# Patient Record
Sex: Female | Born: 1955 | Race: Asian | Hispanic: No | Marital: Married | Smoking: Never smoker
Health system: Southern US, Community
[De-identification: ages and names within clinical notes are randomized; demographics above are authoritative.]

## PROBLEM LIST (undated history)

## (undated) DIAGNOSIS — M810 Age-related osteoporosis without current pathological fracture: Secondary | ICD-10-CM

## (undated) DIAGNOSIS — T7840XA Allergy, unspecified, initial encounter: Secondary | ICD-10-CM

## (undated) DIAGNOSIS — M1990 Unspecified osteoarthritis, unspecified site: Secondary | ICD-10-CM

## (undated) DIAGNOSIS — N952 Postmenopausal atrophic vaginitis: Secondary | ICD-10-CM

## (undated) DIAGNOSIS — F32A Depression, unspecified: Secondary | ICD-10-CM

## (undated) HISTORY — PX: CYST EXCISION: SHX5701

## (undated) HISTORY — DX: Postmenopausal atrophic vaginitis: N95.2

## (undated) HISTORY — PX: ABDOMINAL HYSTERECTOMY: SHX81

## (undated) HISTORY — PX: BREAST BIOPSY: SHX20

## (undated) HISTORY — DX: Allergy, unspecified, initial encounter: T78.40XA

## (undated) HISTORY — DX: Age-related osteoporosis without current pathological fracture: M81.0

## (undated) HISTORY — DX: Depression, unspecified: F32.A

## (undated) HISTORY — DX: Unspecified osteoarthritis, unspecified site: M19.90

---

## 2010-03-01 DIAGNOSIS — D0590 Unspecified type of carcinoma in situ of unspecified breast: Secondary | ICD-10-CM | POA: Insufficient documentation

## 2010-04-22 DIAGNOSIS — J309 Allergic rhinitis, unspecified: Secondary | ICD-10-CM | POA: Insufficient documentation

## 2010-04-22 DIAGNOSIS — L501 Idiopathic urticaria: Secondary | ICD-10-CM | POA: Insufficient documentation

## 2013-01-22 DIAGNOSIS — Z90710 Acquired absence of both cervix and uterus: Secondary | ICD-10-CM | POA: Insufficient documentation

## 2019-11-27 DIAGNOSIS — R220 Localized swelling, mass and lump, head: Secondary | ICD-10-CM | POA: Insufficient documentation

## 2020-02-16 DIAGNOSIS — M7651 Patellar tendinitis, right knee: Secondary | ICD-10-CM | POA: Insufficient documentation

## 2020-02-16 DIAGNOSIS — R7303 Prediabetes: Secondary | ICD-10-CM | POA: Insufficient documentation

## 2020-02-16 DIAGNOSIS — M19041 Primary osteoarthritis, right hand: Secondary | ICD-10-CM | POA: Insufficient documentation

## 2020-02-16 DIAGNOSIS — G8929 Other chronic pain: Secondary | ICD-10-CM | POA: Insufficient documentation

## 2021-01-17 LAB — HM DEXA SCAN

## 2021-02-28 LAB — HM MAMMOGRAPHY

## 2021-03-09 ENCOUNTER — Other Ambulatory Visit: Payer: Self-pay

## 2021-03-09 ENCOUNTER — Ambulatory Visit (LOCAL_COMMUNITY_HEALTH_CENTER): Payer: Medicare HMO

## 2021-03-09 DIAGNOSIS — Z23 Encounter for immunization: Secondary | ICD-10-CM

## 2021-03-09 NOTE — Progress Notes (Signed)
In Nurse Clinic with husband. Reports Td vaccine greater than 10 yrs ago. Tdap administered today and tolerated well. Updated [facility] copy given. Jerel Shepherd, RN

## 2021-03-28 DIAGNOSIS — N390 Urinary tract infection, site not specified: Secondary | ICD-10-CM | POA: Insufficient documentation

## 2021-07-14 DIAGNOSIS — R5383 Other fatigue: Secondary | ICD-10-CM | POA: Diagnosis not present

## 2021-07-14 DIAGNOSIS — R059 Cough, unspecified: Secondary | ICD-10-CM | POA: Diagnosis not present

## 2021-07-14 DIAGNOSIS — U071 COVID-19: Secondary | ICD-10-CM | POA: Diagnosis not present

## 2021-07-14 DIAGNOSIS — R509 Fever, unspecified: Secondary | ICD-10-CM | POA: Diagnosis not present

## 2021-07-14 DIAGNOSIS — R051 Acute cough: Secondary | ICD-10-CM | POA: Diagnosis not present

## 2021-07-26 DIAGNOSIS — R059 Cough, unspecified: Secondary | ICD-10-CM | POA: Diagnosis not present

## 2021-07-26 DIAGNOSIS — J22 Unspecified acute lower respiratory infection: Secondary | ICD-10-CM | POA: Diagnosis not present

## 2021-07-26 DIAGNOSIS — B9689 Other specified bacterial agents as the cause of diseases classified elsewhere: Secondary | ICD-10-CM | POA: Diagnosis not present

## 2021-07-26 DIAGNOSIS — R051 Acute cough: Secondary | ICD-10-CM | POA: Diagnosis not present

## 2021-07-26 DIAGNOSIS — J329 Chronic sinusitis, unspecified: Secondary | ICD-10-CM | POA: Diagnosis not present

## 2021-08-23 DIAGNOSIS — J329 Chronic sinusitis, unspecified: Secondary | ICD-10-CM | POA: Diagnosis not present

## 2021-08-23 DIAGNOSIS — R918 Other nonspecific abnormal finding of lung field: Secondary | ICD-10-CM | POA: Diagnosis not present

## 2021-08-23 DIAGNOSIS — J188 Other pneumonia, unspecified organism: Secondary | ICD-10-CM | POA: Diagnosis not present

## 2021-08-23 DIAGNOSIS — Z8616 Personal history of COVID-19: Secondary | ICD-10-CM | POA: Diagnosis not present

## 2021-08-23 DIAGNOSIS — B9689 Other specified bacterial agents as the cause of diseases classified elsewhere: Secondary | ICD-10-CM | POA: Diagnosis not present

## 2021-08-23 DIAGNOSIS — J948 Other specified pleural conditions: Secondary | ICD-10-CM | POA: Diagnosis not present

## 2021-08-30 ENCOUNTER — Encounter: Payer: Self-pay | Admitting: Internal Medicine

## 2021-08-30 ENCOUNTER — Ambulatory Visit (INDEPENDENT_AMBULATORY_CARE_PROVIDER_SITE_OTHER): Payer: Medicare Other | Admitting: Internal Medicine

## 2021-08-30 VITALS — BP 124/72 | HR 81 | Temp 98.1°F | Resp 16 | Ht 61.0 in | Wt 131.9 lb

## 2021-08-30 DIAGNOSIS — E785 Hyperlipidemia, unspecified: Secondary | ICD-10-CM | POA: Insufficient documentation

## 2021-08-30 DIAGNOSIS — M810 Age-related osteoporosis without current pathological fracture: Secondary | ICD-10-CM | POA: Insufficient documentation

## 2021-08-30 DIAGNOSIS — R058 Other specified cough: Secondary | ICD-10-CM | POA: Diagnosis not present

## 2021-08-30 DIAGNOSIS — M816 Localized osteoporosis [Lequesne]: Secondary | ICD-10-CM | POA: Diagnosis not present

## 2021-08-30 DIAGNOSIS — Z1211 Encounter for screening for malignant neoplasm of colon: Secondary | ICD-10-CM

## 2021-08-30 DIAGNOSIS — E782 Mixed hyperlipidemia: Secondary | ICD-10-CM

## 2021-08-30 MED ORDER — FLUTICASONE PROPIONATE 50 MCG/ACT NA SUSP
2.0000 | Freq: Every day | NASAL | 6 refills | Status: DC
Start: 2021-08-30 — End: 2022-02-27

## 2021-08-30 NOTE — Patient Instructions (Addendum)
It was great seeing you today!  Plan discussed at today's visit: -Continue Fosamax weekly, be sure to drink a full glass of water with this medication and do not lay down for 30 minutes after -Take at least 1000 IU Vitamin D daily  -Increase walking to 30 minutes 5 times a day  -For shoulder pain, take anti-inflammatories and exercises to increase range of motion  -Cologuard ordered for colon cancer screening   Follow up in: 6 months   Take care and let us know if you have any questions or concerns prior to your next visit.  Dr. Caralee Ates  Shoulder Range of Motion Exercises Shoulder range of motion (ROM) exercises are done to keep the shoulder moving freely or to increase movement. They are often recommended for people who have shoulder pain or stiffness or who are recovering from a shoulder surgery. Phase 1 exercises When you are able, do this exercise 1-2 times per day for 30-60 seconds in each direction, or as directed by your health care provider. Pendulum exercise To do this exercise while sitting: Sit in a chair or at the edge of your bed with your feet flat on the floor. Let your affected arm hang down in front of you over the edge of the bed or chair. Relax your shoulder, arm, and hand. Rock your body so your arm gently swings in small circles. You can also use your unaffected arm to start the motion. Repeat changing the direction of the circles, swinging your arm left and right, and swinging your arm forward and back. To do this exercise while standing: Stand next to a sturdy chair or table, and hold on to it with your hand on your unaffected side. Bend forward at the waist. Bend your knees slightly. Relax your shoulder, arm, and hand. While keeping your shoulder relaxed, use body motion to swing your arm in small circles. Repeat changing the direction of the circles, swinging your arm left and right, and swinging your arm forward and back. Between exercises, stand up tall and  take a short break to relax your lower back.   Phase 2 exercises Do these exercises 1-2 times per day or as told by your health care provider. Hold each stretch for 30 seconds, and repeat 3 times. Do the exercises with one or both arms as instructed by your health care provider. For these exercises, sit at a table with your hand and arm supported by the table. A chair that slides easily or has wheels can be helpful. External rotation Turn your chair so that your affected side is nearest to the table. Place your forearm on the table to your side. Bend your elbow about 90 at the elbow (right angle) and place your hand palm facing down on the table. Your elbow should be about 6 inches away from your side. Keeping your arm on the table, lean your body forward. Abduction Turn your chair so that your affected side is nearest to the table. Place your forearm and hand on the table so that your thumb points toward the ceiling and your arm is straight out to your side. Slide your hand out to the side and away from you, using your unaffected arm to do the work. To increase the stretch, you can slide your chair away from the table. Flexion: forward stretch Sit facing the table. Place your hand and elbow on the table in front of you. Slide your hand forward and away from you, using your unaffected arm to  do the work. To increase the stretch, you can slide your chair backward. Phase 3 exercises Do these exercises 1-2 times per day or as told by your health care provider. Hold each stretch for 30 seconds, and repeat 3 times. Do the exercises with one or both arms as instructed by your health care provider. Cross-body stretch: posterior capsule stretch Lift your arm straight out in front of you. Bend your arm 90 at the elbow (right angle) so your forearm moves across your body. Use your other arm to gently pull the elbow across your body, toward your other shoulder. Wall climbs Stand with your affected  arm extended out to the side with your hand resting on a door frame. Slide your hand slowly up the door frame. To increase the stretch, step through the door frame. Keep your body upright and do not lean. Wand exercises You will need a cane, a piece of PVC pipe, or a sturdy wooden dowel for wand exercises. Flexion To do this exercise while standing: Hold the wand with both of your hands, palms down. Using the other arm to help, lift your arms up and over your head, if able. Push upward with your other arm to gently increase the stretch. To do this exercise while lying down: Lie on your back with your elbows resting on the floor and the wand in both your hands. Your hands will be palm down, or pointing toward your feet. Lift your hands toward the ceiling, using your unaffected arm to help if needed. Bring your arms overhead as able, using your unaffected arm to help if needed. Internal rotation Stand while holding the wand behind you with both hands. Your unaffected arm should be extended above your head with the arm of the affected side extended behind you at the level of your waist. The wand should be pointing straight up and down as you hold it. Slowly pull the wand up behind your back by straightening the elbow of your unaffected arm and bending the elbow of your affected arm. External rotation Lie on your back with your affected upper arm supported on a small pillow or rolled towel. When you first do this exercise, keep your upper arm close to your body. Over time, bring your arm up to a 90 angle out to the side. Hold the wand across your stomach and with both hands palm up. Your elbow on your affected side should be bent at a 90 angle. Use your unaffected side to help push your forearm away from you and toward the floor. Keep your elbow on your affected side bent at a 90 angle. Contact a health care provider if you have: New or increasing pain. New numbness, tingling, weakness, or  discoloration in your arm or hand. This information is not intended to replace advice given to you by your health care provider. Make sure you discuss any questions you have with your health care provider. Document Revised: 08/08/2017 Document Reviewed: 08/08/2017 Elsevier Patient Education  2022 [employer].

## 2021-08-30 NOTE — Assessment & Plan Note (Signed)
Per DEXA scan 7/22. Started on Fosamax August, 2022. Discussed Vitamin D and calcium and weight bearing exercises. Plan to repeat bone scan in 2 years.

## 2021-08-30 NOTE — Progress Notes (Signed)
New Patient Office Visit  Subjective:  Patient ID: Jade Reese, female    DOB: [birth year]  Age: 66 y.o. MRN: [id number]  CC:  Chief Complaint  Patient presents with   Establish Care   post covid    Night cough.  Pt states went to [facility] had abnormal xray x2   Joint Pain    Right hand ring knuckle, elbow pain and knee pain   Cyst    On forehead     HPI Jade Reese presents as a new patient. Chronic  medical conditions include osteoporosis diagnosed last summer.   Osteoporosis: -Per DEXA 7/11 lumbar spine -3.3, left hip -1.9 -Taking Fosamax weekly since August 2022 -Taking Vitamin D as well, uncertain of dose -Vitamin D levels in 7/22 low/normal at 30.3   Left Shoulder Pain: [place] on stairs after a mechanical fall last November and fell on her left shoulder  Location: posterior Quality:  aching Frequency: intermittent Radiation: no Aggravating factors: lifting  Alleviating factors: rest  Status: better ROM still slightly limited, working on strength   Chronic Cough Post-COVID: -Tested positive for COVID 07/12/21, treated with anti-virals -Went to UC and had CXR which showed RUL PNA. She was treated with antibiotics and followed up for repeat x-ray, which I cannot view -Current symptoms: cough at night some days, voice hoarseness. Denies fevers, SOB, wheezing, sore throat.  HLD: -Medications: None -Last lipid panel: 7/22 TC 207, triglycerides 135, LDL 120, HDL 60  The 10-year ASCVD risk score (Arnett DK, et al., 2019) is: 5.7%   Values used to calculate the score:     Age: 41 years     Sex: Female     Is Non-Hispanic African American: No     Diabetic: No     Tobacco smoker: No     Systolic Blood Pressure: [id number] mmHg     Is BP treated: No     HDL Cholesterol: 60 mg/dL     Total Cholesterol: 207 mg/dL  Health Maintenance: -Blood work UTD -Mammogram/DEXA: Mammogram last summer, plan to schedule again this summer. DEXA results above.  -Colon cancer screening:  uncertain    Past Medical History:  Diagnosis Date   Arthritis    Osteoporosis    Vaginal atrophy     No family history on file.  Social History   Socioeconomic History   Marital status: Married    Spouse name: Not on file   Number of children: Not on file   Years of education: Not on file   Highest education level: Not on file  Occupational History   Not on file  Tobacco Use   Smoking status: Not on file   Smokeless tobacco: Not on file  Substance and Sexual Activity   Alcohol use: Not on file   Drug use: Not on file   Sexual activity: Not on file  Other Topics Concern   Not on file  Social History Narrative   Not on file   Social Determinants of Health   Financial Resource Strain: Not on file  Food Insecurity: Not on file  Transportation Needs: Not on file  Physical Activity: Not on file  Stress: Not on file  Social Connections: Not on file  Intimate Partner Violence: Not on file    ROS Review of Systems  Constitutional:  Negative for chills and fever.  HENT:  Positive for postnasal drip and voice change. Negative for congestion, sinus pressure, sinus pain and sore throat.   Eyes:  Negative for visual disturbance.  Respiratory:  Positive for cough. Negative for shortness of breath and wheezing.   Cardiovascular:  Negative for chest pain.  Gastrointestinal:  Negative for abdominal pain, nausea and vomiting.  Musculoskeletal:  Positive for arthralgias.  Neurological:  Negative for dizziness and headaches.   Objective:   Today's Vitals: BP 124/72    Pulse 81    Temp 98.1 F (36.7 C)    Resp 16    Ht 5\' 1"  (1.549 m)    Wt 131 lb 14.4 oz (59.8 kg)    SpO2 97%    BMI 24.92 kg/m   Physical Exam Constitutional:      Appearance: Normal appearance.  HENT:     Head: Normocephalic and atraumatic.     Mouth/Throat:     Mouth: Mucous membranes are moist.     Comments: Post nasal drip present Eyes:     Conjunctiva/sclera: Conjunctivae normal.   Cardiovascular:     Rate and Rhythm: Normal rate and regular rhythm.  Pulmonary:     Effort: Pulmonary effort is normal.     Breath sounds: Normal breath sounds.  Musculoskeletal:        General: Tenderness present.     Right lower leg: No edema.     Left lower leg: No edema.     Comments: Tenderness over spine of scapula on left, ROM 5/5 of left shoulder with exception abduction 4/5  Skin:    General: Skin is warm and dry.  Neurological:     General: No focal deficit present.     Mental Status: She is alert. Mental status is at baseline.  Psychiatric:        Mood and Affect: Mood normal.        Behavior: Behavior normal.    Assessment & Plan:   Problem List Items Addressed This Visit       Musculoskeletal and Integument   Osteoporosis    Per DEXA scan 7/22. Started on Fosamax August, 2022. Discussed Vitamin D and calcium and weight bearing exercises. Plan to repeat bone scan in 2 years.      Relevant Medications   alendronate (FOSAMAX) 70 MG tablet     Other   HLD (hyperlipidemia)    Reviewed last lipid panel, ASCVD risk 5.7%. Discussed lifestyle management, plan to recheck at follow up.       Other Visit Diagnoses     Colon cancer screening    -  Primary   Relevant Orders   Cologuard   Post-viral cough syndrome       Relevant Medications   fluticasone (FLONASE) 50 MCG/ACT nasal spray       Outpatient Encounter Medications as of 08/30/2021  Medication Sig   Phenazopyridine HCl (AZO TABS PO) Take by mouth.   No facility-administered encounter medications on file as of 08/30/2021.    Follow-up: Return in about 6 months (around 02/27/2022).   Teodora Medici, DO

## 2021-08-30 NOTE — Assessment & Plan Note (Signed)
Reviewed last lipid panel, ASCVD risk 5.7%. Discussed lifestyle management, plan to recheck at follow up.

## 2021-09-07 ENCOUNTER — Encounter: Payer: Self-pay | Admitting: Internal Medicine

## 2021-09-07 DIAGNOSIS — R058 Other specified cough: Secondary | ICD-10-CM

## 2021-09-07 DIAGNOSIS — J948 Other specified pleural conditions: Secondary | ICD-10-CM

## 2021-09-08 NOTE — Telephone Encounter (Signed)
Pt was informed.

## 2021-09-09 ENCOUNTER — Encounter: Payer: Self-pay | Admitting: Internal Medicine

## 2021-09-24 DIAGNOSIS — Z1211 Encounter for screening for malignant neoplasm of colon: Secondary | ICD-10-CM | POA: Diagnosis not present

## 2021-10-01 LAB — COLOGUARD: COLOGUARD: NEGATIVE

## 2021-10-10 ENCOUNTER — Ambulatory Visit
Admission: RE | Admit: 2021-10-10 | Discharge: 2021-10-10 | Disposition: A | Discharge status: Home / Self Care | Payer: Medicare Other | Attending: Internal Medicine | Admitting: Internal Medicine

## 2021-10-10 ENCOUNTER — Ambulatory Visit
Admission: RE | Admit: 2021-10-10 | Discharge: 2021-10-10 | Disposition: A | Discharge status: Home / Self Care | Payer: Medicare Other | Source: Ambulatory Visit | Attending: Internal Medicine | Admitting: Internal Medicine

## 2021-10-10 DIAGNOSIS — R058 Other specified cough: Secondary | ICD-10-CM | POA: Insufficient documentation

## 2021-10-10 DIAGNOSIS — J948 Other specified pleural conditions: Secondary | ICD-10-CM

## 2021-10-21 ENCOUNTER — Telehealth: Payer: Self-pay | Admitting: Internal Medicine

## 2021-10-21 NOTE — Telephone Encounter (Signed)
Copied from [place] [phone]. Topic: Medicare AWV ?>> Oct 21, 2021  9:10 AM Cher Nakai R wrote: ?Reason for CRM:  ? ?Left message for patient to call back and schedule Medicare Annual Wellness Visit (AWV) in office.  ? ?If unable to come into the office for AWV,  please offer to do virtually or by telephone. ? ?No hx of AWV eligible for AWVI per [facility] as of 06/09/2021 ? ?Please schedule at anytime with [place].     ? ?45 minute appointment  ? ?Any questions, please call me at [phone] ?

## 2021-11-03 ENCOUNTER — Ambulatory Visit (INDEPENDENT_AMBULATORY_CARE_PROVIDER_SITE_OTHER): Payer: Medicare Other

## 2021-11-03 DIAGNOSIS — Z0000 Encounter for general adult medical examination without abnormal findings: Secondary | ICD-10-CM | POA: Diagnosis not present

## 2021-11-03 NOTE — Patient Instructions (Signed)
Ms. Smyre , ?Thank you for taking time to come for your Medicare Wellness Visit. I appreciate your ongoing commitment to your health goals. Please review the following plan we discussed and let me know if I can assist you in the future.  ? ?Screening recommendations/referrals: ?Colonoscopy: Cologuard done 09/24/21. Repeat 09/2024 ?Mammogram: done 02/28/21 ?Bone Density: done 01/17/21 ?Recommended yearly ophthalmology/optometry visit for glaucoma screening and checkup ?Recommended yearly dental visit for hygiene and checkup ? ?Vaccinations: ?Influenza vaccine: done 06/09/21 ?Pneumococcal vaccine: done 01/11/21 ?Tdap vaccine: done 03/09/21 ?Shingles vaccine: please bring vaccine record to your next appt   ?Covid-19:done 10/16/19, 11/06/19, 06/30/20 & 03/01/21 ? ?Advanced directives: Advance directive discussed with you today. I have provided a copy for you to complete at home and have notarized. Once this is complete please bring a copy in to our office so we can scan it into your chart.  ? ?Conditions/risks identified: Keep up the great work! ? ?Next appointment: Follow up in one year for your annual wellness visit  ? ? ?Preventive Care 67 Years and Older, Female ?Preventive care refers to lifestyle choices and visits with your health care provider that can promote health and wellness. ?What does preventive care include? ?A yearly physical exam. This is also called an annual well check. ?Dental exams once or twice a year. ?Routine eye exams. Ask your health care provider how often you should have your eyes checked. ?Personal lifestyle choices, including: ?Daily care of your teeth and gums. ?Regular physical activity. ?Eating a healthy diet. ?Avoiding tobacco and drug use. ?Limiting alcohol use. ?Practicing safe sex. ?Taking low-dose aspirin every day. ?Taking vitamin and mineral supplements as recommended by your health care provider. ?What happens during an annual well check? ?The services and screenings done by your health  care provider during your annual well check will depend on your age, overall health, lifestyle risk factors, and family history of disease. ?Counseling  ?Your health care provider may ask you questions about your: ?Alcohol use. ?Tobacco use. ?Drug use. ?Emotional well-being. ?Home and relationship well-being. ?Sexual activity. ?Eating habits. ?History of falls. ?Memory and ability to understand (cognition). ?Work and work Astronomer. ?Reproductive health. ?Screening  ?You may have the following tests or measurements: ?Height, weight, and BMI. ?Blood pressure. ?Lipid and cholesterol levels. These may be checked every 5 years, or more frequently if you are over 2 years old. ?Skin check. ?Lung cancer screening. You may have this screening every year starting at age 51 if you have a 30-pack-year history of smoking and currently smoke or have quit within the past 15 years. ?Fecal occult blood test (FOBT) of the stool. You may have this test every year starting at age 49. ?Flexible sigmoidoscopy or colonoscopy. You may have a sigmoidoscopy every 5 years or a colonoscopy every 10 years starting at age 50. ?Hepatitis C blood test. ?Hepatitis B blood test. ?Sexually transmitted disease (STD) testing. ?Diabetes screening. This is done by checking your blood sugar (glucose) after you have not eaten for a while (fasting). You may have this done every 1-3 years. ?Bone density scan. This is done to screen for osteoporosis. You may have this done starting at age 74. ?Mammogram. This may be done every 1-2 years. Talk to your health care provider about how often you should have regular mammograms. ?Talk with your health care provider about your test results, treatment options, and if necessary, the need for more tests. ?Vaccines  ?Your health care provider may recommend certain vaccines, such as: ?Influenza vaccine. This  is recommended every year. ?Tetanus, diphtheria, and acellular pertussis (Tdap, Td) vaccine. You may need a Td  booster every 10 years. ?Zoster vaccine. You may need this after age 18. ?Pneumococcal 13-valent conjugate (PCV13) vaccine. One dose is recommended after age 53. ?Pneumococcal polysaccharide (PPSV23) vaccine. One dose is recommended after age 41. ?Talk to your health care provider about which screenings and vaccines you need and how often you need them. ?This information is not intended to replace advice given to you by your health care provider. Make sure you discuss any questions you have with your health care provider. ?Document Released: 07/23/2015 Document Revised: 03/15/2016 Document Reviewed: 04/27/2015 ?Elsevier Interactive Patient Education ? 2017 [employer]. ? ?Fall Prevention in the Home ?Falls can cause injuries. They can happen to people of all ages. There are many things you can do to make your home safe and to help prevent falls. ?What can I do on the outside of my home? ?Regularly fix the edges of walkways and driveways and fix any cracks. ?Remove anything that might make you trip as you walk through a door, such as a raised step or threshold. ?Trim any bushes or trees on the path to your home. ?Use bright outdoor lighting. ?Clear any walking paths of anything that might make someone trip, such as rocks or tools. ?Regularly check to see if handrails are loose or broken. Make sure that both sides of any steps have handrails. ?Any raised decks and porches should have guardrails on the edges. ?Have any leaves, snow, or ice cleared regularly. ?Use sand or salt on walking paths during winter. ?Clean up any spills in your garage right away. This includes oil or grease spills. ?What can I do in the bathroom? ?Use night lights. ?Install grab bars by the toilet and in the tub and shower. Do not use towel bars as grab bars. ?Use non-skid mats or decals in the tub or shower. ?If you need to sit down in the shower, use a plastic, non-slip stool. ?Keep the floor dry. Clean up any water that spills on the floor  as soon as it happens. ?Remove soap buildup in the tub or shower regularly. ?Attach bath mats securely with double-sided non-slip rug tape. ?Do not have throw rugs and other things on the floor that can make you trip. ?What can I do in the bedroom? ?Use night lights. ?Make sure that you have a light by your bed that is easy to reach. ?Do not use any sheets or blankets that are too big for your bed. They should not hang down onto the floor. ?Have a firm chair that has side arms. You can use this for support while you get dressed. ?Do not have throw rugs and other things on the floor that can make you trip. ?What can I do in the kitchen? ?Clean up any spills right away. ?Avoid walking on wet floors. ?Keep items that you use a lot in easy-to-reach places. ?If you need to reach something above you, use a strong step stool that has a grab bar. ?Keep electrical cords out of the way. ?Do not use floor polish or wax that makes floors slippery. If you must use wax, use non-skid floor wax. ?Do not have throw rugs and other things on the floor that can make you trip. ?What can I do with my stairs? ?Do not leave any items on the stairs. ?Make sure that there are handrails on both sides of the stairs and use them. Fix handrails that  are broken or loose. Make sure that handrails are as long as the stairways. ?Check any carpeting to make sure that it is firmly attached to the stairs. Fix any carpet that is loose or worn. ?Avoid having throw rugs at the top or bottom of the stairs. If you do have throw rugs, attach them to the floor with carpet tape. ?Make sure that you have a light switch at the top of the stairs and the bottom of the stairs. If you do not have them, ask someone to add them for you. ?What else can I do to help prevent falls? ?Wear shoes that: ?Do not have high heels. ?Have rubber bottoms. ?Are comfortable and fit you well. ?Are closed at the toe. Do not wear sandals. ?If you use a stepladder: ?Make sure that it is  fully opened. Do not climb a closed stepladder. ?Make sure that both sides of the stepladder are locked into place. ?Ask someone to hold it for you, if possible. ?Clearly mark and make sure that you can

## 2021-11-03 NOTE — Progress Notes (Signed)
? ?Subjective:  ? Jade Reese is a 66 y.o. female who presents for an Initial Medicare Annual Wellness Visit. ? ?Virtual Visit via Telephone Note ? ?I connected with  Jade Reese on 11/03/21 at  3:30 PM EDT by telephone and verified that I am speaking with the correct person using two identifiers. ? ?Location: ?Patient: home ?Provider: [facility] ?Persons participating in the virtual visit: patient/Nurse Health Advisor ?  ?I discussed the limitations, risks, security and privacy concerns of performing an evaluation and management service by telephone and the availability of in person appointments. The patient expressed understanding and agreed to proceed. ? ?Interactive audio and video telecommunications were attempted between this nurse and patient, however failed, due to patient having technical difficulties OR patient did not have access to video capability.  We continued and completed visit with audio only. ? ?Some vital signs may be absent or patient reported.  ? ?Reather Littler, LPN ? ? ?Review of Systems    ? ?Cardiac Risk Factors include: advanced age (>39men, >4 women) ? ?   ?Objective:  ?  ?Today's Vitals  ? 11/03/21 1518  ?PainSc: 2   ? ?There is no height or weight on file to calculate BMI. ? ? ?  11/03/2021  ?  3:30 PM  ?Advanced Directives  ?Does Patient Have a Medical Advance Directive? No  ?Would patient like information on creating a medical advance directive? Yes (MAU/Ambulatory/Procedural Areas - Information given)  ? ? ?Current Medications (verified) ?Outpatient Encounter Medications as of 11/03/2021  ?Medication Sig  ? alendronate (FOSAMAX) 70 MG tablet Take by mouth.  ? Calcium Carb-Cholecalciferol (CALCIUM 600+D3 PO) Take by mouth.  ? fluticasone (FLONASE) 50 MCG/ACT nasal spray Place 2 sprays into both nostrils daily.  ? Multiple Vitamins-Minerals (MULTIVITAMIN WOMEN) TABS Take by mouth.  ? Polyethylene Glycol 3350 (MIRALAX PO) Take by mouth.  ? [DISCONTINUED] estradiol (ESTRACE) 0.1 MG/GM  vaginal cream Place vaginally.  ? ?No facility-administered encounter medications on file as of 11/03/2021.  ? ? ?Allergies (verified) ?Penicillins, Bee venom, Oxaprozin, and Sodium hyaluronate (avian)  ? ?History: ?Past Medical History:  ?Diagnosis Date  ? Arthritis   ? Osteoporosis   ? Vaginal atrophy   ? ?History reviewed. No pertinent family history. ?Social History  ? ?Socioeconomic History  ? Marital status: Married  ?  Spouse name: Not on file  ? Number of children: Not on file  ? Years of education: Not on file  ? Highest education level: Not on file  ?Occupational History  ? Not on file  ?Tobacco Use  ? Smoking status: Never  ? Smokeless tobacco: Never  ?Vaping Use  ? Vaping Use: Never used  ?Substance and Sexual Activity  ? Alcohol use: Never  ? Drug use: Never  ? Sexual activity: Yes  ?Other Topics Concern  ? Not on file  ?Social History Narrative  ? Not on file  ? ?Social Determinants of Health  ? ?Financial Resource Strain: Low Risk   ? Difficulty of Paying Living Expenses: Not hard at all  ?Food Insecurity: No Food Insecurity  ? Worried About Programme researcher, broadcasting/film/video in the Last Year: Never true  ? Ran Out of Food in the Last Year: Never true  ?Transportation Needs: No Transportation Needs  ? Lack of Transportation (Medical): No  ? Lack of Transportation (Non-Medical): No  ?Physical Activity: Sufficiently Active  ? Days of Exercise per Week: 6 days  ? Minutes of Exercise per Session: 60 min  ?Stress:  No Stress Concern Present  ? Feeling of Stress : Not at all  ?Social Connections: Moderately Isolated  ? Frequency of Communication with Friends and Family: More than three times a week  ? Frequency of Social Gatherings with Friends and Family: Twice a week  ? Attends Religious Services: Never  ? Active Member of Clubs or Organizations: No  ? Attends BankerClub or Organization Meetings: Never  ? Marital Status: Married  ? ? ?Tobacco Counseling ?Counseling given: Not Answered ? ? ?Clinical Intake: ? ?Pre-visit  preparation completed: Yes ? ?Pain : 0-10 ?Pain Score: 2  ?Pain Type: Chronic pain ?Pain Location: Finger (Comment which one) (ring finger) ?Pain Orientation: Right ?Pain Descriptors / Indicators: Numbness, Tightness ?Pain Onset: More than a month ago ?Pain Frequency: Constant ? ?  ? ?Nutritional Risks: None ?Diabetes: No ? ?How often do you need to have someone help you when you read instructions, pamphlets, or other written materials from your doctor or pharmacy?: 1 - Never ? ? ?Interpreter Needed?: No ? ?Information entered by :: Reather LittlerKasey Aerianna Losey LPN ? ? ?Activities of Daily Living ? ?  11/03/2021  ?  3:31 PM 08/30/2021  ?  1:15 PM  ?In your present state of health, do you have any difficulty performing the following activities:  ?Hearing? 0 0  ?Vision? 0 0  ?Difficulty concentrating or making decisions? 0 0  ?Walking or climbing stairs? 0 0  ?Dressing or bathing? 0 0  ?Doing errands, shopping? 0 0  ?Preparing Food and eating ? N   ?Using the Toilet? N   ?In the past six months, have you accidently leaked urine? N   ?Do you have problems with loss of bowel control? N   ?Managing your Medications? N   ?Managing your Finances? N   ?Housekeeping or managing your Housekeeping? N   ? ? ?Patient Care Team: ?Margarita MailAndrews, Elisabeth, DO as PCP - General (Internal Medicine) ? ?Indicate any recent Medical Services you may have received from other than [facility] providers in the past year (date may be approximate). ? ?   ?Assessment:  ? This is a routine wellness examination for Jade Reese. ? ?Hearing/Vision screen ?Hearing Screening - Comments:: Pt denies hearing difficulty ? ?Vision Screening - Comments:: Due for eye exam; [facility]  ? ?Dietary issues and exercise activities discussed: ?Current Exercise Habits: Home exercise routine, Type of exercise: walking, Time (Minutes): 60, Frequency (Times/Week): 6, Weekly Exercise (Minutes/Week): 360, Intensity: Moderate, Exercise limited by: None identified ? ? Goals Addressed    ?None ?  ? ?Depression Screen ? ?  11/03/2021  ?  3:29 PM 08/30/2021  ?  1:15 PM  ?PHQ 2/9 Scores  ?PHQ - 2 Score 0 2  ?PHQ- 9 Score  2  ?  ?Fall Risk ? ?  11/03/2021  ?  3:31 PM 08/30/2021  ?  1:15 PM  ?Fall Risk   ?Falls in the past year? 0 1  ?Number falls in past yr: 0 0  ?Injury with Fall? 0 1  ?Risk for fall due to : No Fall Risks Impaired balance/gait;Impaired mobility  ?Follow up Falls prevention discussed   ? ? ?FALL RISK PREVENTION PERTAINING TO THE HOME: ? ?Any stairs in or around the home? Yes  ?If so, are there any without handrails? No  ?Home free of loose throw rugs in walkways, pet beds, electrical cords, etc? Yes  ?Adequate lighting in your home to reduce risk of falls? Yes  ? ?ASSISTIVE DEVICES UTILIZED TO PREVENT  FALLS: ? ?Life alert? No  ?Use of a cane, walker or w/c? No  ?Grab bars in the bathroom? Yes  ?Shower chair or bench in shower? No  ?Elevated toilet seat or a handicapped toilet? Yes  ? ?TIMED UP AND GO: ? ?Was the test performed? No . Telephonic visit. ? ?Cognitive Function: Normal cognitive status assessed by direct observation by this Nurse Health Advisor. No abnormalities found.  ? ?  ?  ?  ? ?Immunizations ?Immunization History  ?Administered Date(s) Administered  ? Influenza Nasal 06/09/2021  ? PFIZER(Purple Top)SARS-COV-2 Vaccination 10/16/2019, 11/06/2019, 06/30/2020, 03/01/2021  ? PNEUMOCOCCAL CONJUGATE-20 03/01/2021  ? Tdap 03/09/2021  ? ? ?TDAP status: Up to date ? ?Flu Vaccine status: Up to date ? ?Pneumococcal vaccine status: Up to date ? ?Covid-19 vaccine status: Completed vaccines ? ?Qualifies for Shingles Vaccine? Yes   ?Zostavax completed No   ?Shingrix Completed?: Yes - need records ? ?Screening Tests ?Health Maintenance  ?Topic Date Due  ? Hepatitis C Screening  Never done  ? Zoster Vaccines- Shingrix (1 of 2) Never done  ? COVID-19 Vaccine (5 - Booster for Pfizer series) 04/26/2021  ? INFLUENZA VACCINE  02/07/2022  ? MAMMOGRAM  03/01/2023  ? Fecal DNA (Cologuard)   09/24/2024  ? TETANUS/TDAP  03/10/2031  ? Pneumonia Vaccine 64+ Years old  Completed  ? DEXA SCAN  Completed  ? HPV VACCINES  Aged Out  ? ? ?Health Maintenance ? ?Health Maintenance Due  ?Topic Date Due  ? Hepatitis C

## 2022-01-05 ENCOUNTER — Telehealth: Payer: Self-pay | Admitting: Internal Medicine

## 2022-01-05 DIAGNOSIS — Z1231 Encounter for screening mammogram for malignant neoplasm of breast: Secondary | ICD-10-CM

## 2022-01-05 NOTE — Telephone Encounter (Signed)
Copied from CRM [id number]. Topic: Appointment Scheduling - Scheduling Inquiry for Clinic >> Jan 05, 2022  9:55 AM Franchot Heidelberg wrote: Reason for CRM: Mammogram request, preferably in [place]

## 2022-01-25 DIAGNOSIS — H40003 Preglaucoma, unspecified, bilateral: Secondary | ICD-10-CM | POA: Diagnosis not present

## 2022-01-25 DIAGNOSIS — H524 Presbyopia: Secondary | ICD-10-CM | POA: Diagnosis not present

## 2022-02-09 ENCOUNTER — Other Ambulatory Visit: Payer: Self-pay

## 2022-02-09 ENCOUNTER — Ambulatory Visit (INDEPENDENT_AMBULATORY_CARE_PROVIDER_SITE_OTHER): Payer: Medicare Other | Admitting: Nurse Practitioner

## 2022-02-09 ENCOUNTER — Encounter: Payer: Self-pay | Admitting: Nurse Practitioner

## 2022-02-09 VITALS — BP 120/80 | HR 80 | Temp 98.6°F | Resp 18 | Ht 61.0 in | Wt 132.1 lb

## 2022-02-09 DIAGNOSIS — R309 Painful micturition, unspecified: Secondary | ICD-10-CM

## 2022-02-09 DIAGNOSIS — M816 Localized osteoporosis [Lequesne]: Secondary | ICD-10-CM

## 2022-02-09 LAB — POCT URINALYSIS DIPSTICK
Bilirubin, UA: NEGATIVE
Glucose, UA: NEGATIVE
Ketones, UA: NEGATIVE
Nitrite, UA: NEGATIVE
Protein, UA: POSITIVE — AB
Spec Grav, UA: 1.02 (ref 1.010–1.025)
Urobilinogen, UA: 0.2 E.U./dL
pH, UA: 5 (ref 5.0–8.0)

## 2022-02-09 MED ORDER — PHENAZOPYRIDINE HCL 100 MG PO TABS: 100.0000 mg | ORAL_TABLET | Freq: Three times a day (TID) | ORAL | 0 refills | Status: DC | PRN

## 2022-02-09 MED ORDER — NITROFURANTOIN MONOHYD MACRO 100 MG PO CAPS: 100.0000 mg | ORAL_CAPSULE | Freq: Two times a day (BID) | ORAL | 0 refills | Status: DC

## 2022-02-09 MED ORDER — ALENDRONATE SODIUM 70 MG PO TABS: 70.0000 mg | ORAL_TABLET | ORAL | 3 refills | Status: DC

## 2022-02-09 NOTE — Progress Notes (Signed)
BP 120/80   Pulse 80   Temp 98.6 F (37 C) (Oral)   Resp 18   Ht 5\' 1"  (1.549 m)   Wt 132 lb 1.6 oz (59.9 kg)   SpO2 98%   BMI 24.96 kg/m    Subjective:    Patient ID: , female    DOB: [birth year], 66 y.o.   MRN: [id number]  HPI: Jade Reese is a 66 y.o. female  Chief Complaint  Patient presents with   Dysuria    Discomfort, burning   Dysuria: Patient reports dysuria and urinary frequency started last night.  Patient denies any fever or back pain.  Patient states that she does get frequent urinary tract infections.  Patient states she has been prescribed before estrogen cream and she did try some of that but she said that she did not feel any better.  Is allergic to penicillin medication.  Urinary dip did show large blood, positive for protein and large leukocytes.  We will send urine for culture.  We will start patient on Macrobid and prescribe Pyridium for the pain.  Osteoporosis: Patient reports that she has been taking Fosamax for her osteoporosis.  Patient states that since moving offices she has not been able to get a refill.  We will send in refill of her Fosamax.  Patient also takes vitamin D and calcium that she gets over-the-counter.  Relevant past medical, surgical, family and social history reviewed and updated as indicated. Interim medical history since our last visit reviewed. Allergies and medications reviewed and updated.  Review of Systems  Constitutional: Negative for fever or weight change.  Respiratory: Negative for cough and shortness of breath.   Cardiovascular: Negative for chest pain or palpitations.  Gastrointestinal: Negative for abdominal pain, no bowel changes.  GU: Positive for dysuria and urinary frequency Musculoskeletal: Negative for gait problem or joint swelling.  Skin: Negative for rash.  Neurological: Negative for dizziness or headache.  No other specific complaints in a complete review of systems (except as listed in HPI  above).      Objective:    BP 120/80   Pulse 80   Temp 98.6 F (37 C) (Oral)   Resp 18   Ht 5\' 1"  (1.549 m)   Wt 132 lb 1.6 oz (59.9 kg)   SpO2 98%   BMI 24.96 kg/m   Wt Readings from Last 3 Encounters:  02/09/22 132 lb 1.6 oz (59.9 kg)  08/30/21 131 lb 14.4 oz (59.8 kg)    Physical Exam  Constitutional: Patient appears well-developed and well-nourished.  No distress.  HEENT: head atraumatic, normocephalic, pupils equal and reactive to light, neck supple Cardiovascular: Normal rate, regular rhythm and normal heart sounds.  No murmur heard. No BLE edema. Pulmonary/Chest: Effort normal and breath sounds normal. No respiratory distress. Abdominal: Soft.  There is no tenderness.  No CVA tenderness Psychiatric: Patient has a normal mood and affect. behavior is normal. Judgment and thought content normal.  Results for orders placed or performed in visit on 02/09/22  POCT urinalysis dipstick  Result Value Ref Range   Color, UA yellow    Clarity, UA cloudy    Glucose, UA Negative Negative   Bilirubin, UA negative    Ketones, UA negative    Spec Grav, UA 1.020 1.010 - 1.025   Blood, UA large    pH, UA 5.0 5.0 - 8.0   Protein, UA Positive (A) Negative   Urobilinogen, UA 0.2 0.2 or 1.0  E.U./dL   Nitrite, UA negative    Leukocytes, UA Large (3+) (A) Negative   Appearance cloudy    Odor none       Assessment & Plan:   Problem List Items Addressed This Visit       Musculoskeletal and Integument   Osteoporosis    States she had been taking Fosamax 70 mg weekly.  Patient states that since changing office that she has not had a refill.  We will send in refill.  Patient is also taking calcium and vitamin D over-the-counter.      Relevant Medications   alendronate (FOSAMAX) 70 MG tablet   Other Visit Diagnoses     Pain with urination    -  Primary   Urine dip obtained and urine culture sent.  Patient started on Macrobid and Pyridium.  Push fluids.   Relevant Medications    nitrofurantoin, macrocrystal-monohydrate, (MACROBID) 100 MG capsule   phenazopyridine (PYRIDIUM) 100 MG tablet   Other Relevant Orders   Urine Culture   POCT urinalysis dipstick (Completed)        Follow up plan: Patient has an appointment scheduled with Dr. Caralee Ates at the end of the month.

## 2022-02-09 NOTE — Assessment & Plan Note (Signed)
States she had been taking Fosamax 70 mg weekly.  Patient states that since changing office that she has not had a refill.  We will send in refill.  Patient is also taking calcium and vitamin D over-the-counter.

## 2022-02-10 LAB — URINE CULTURE
MICRO NUMBER:: 13732893
SPECIMEN QUALITY:: ADEQUATE

## 2022-02-17 ENCOUNTER — Encounter: Payer: Self-pay | Admitting: Internal Medicine

## 2022-02-17 ENCOUNTER — Other Ambulatory Visit: Payer: Self-pay | Admitting: Internal Medicine

## 2022-02-17 DIAGNOSIS — G8929 Other chronic pain: Secondary | ICD-10-CM

## 2022-02-17 MED ORDER — DICLOFENAC SODIUM 75 MG PO TBEC: 75.0000 mg | DELAYED_RELEASE_TABLET | Freq: Two times a day (BID) | ORAL | 0 refills | Status: DC

## 2022-02-27 ENCOUNTER — Ambulatory Visit
Admission: RE | Admit: 2022-02-27 | Discharge: 2022-02-27 | Disposition: A | Discharge status: Home / Self Care | Payer: Medicare Other | Source: Ambulatory Visit | Attending: Internal Medicine | Admitting: Internal Medicine

## 2022-02-27 ENCOUNTER — Ambulatory Visit (INDEPENDENT_AMBULATORY_CARE_PROVIDER_SITE_OTHER): Payer: Medicare Other | Admitting: Internal Medicine

## 2022-02-27 ENCOUNTER — Ambulatory Visit
Admission: RE | Admit: 2022-02-27 | Discharge: 2022-02-27 | Disposition: A | Discharge status: Home / Self Care | Payer: Medicare Other | Attending: Internal Medicine | Admitting: Internal Medicine

## 2022-02-27 VITALS — BP 116/72 | HR 66 | Temp 98.0°F | Resp 16 | Ht 61.0 in | Wt 133.0 lb

## 2022-02-27 DIAGNOSIS — E785 Hyperlipidemia, unspecified: Secondary | ICD-10-CM | POA: Diagnosis not present

## 2022-02-27 DIAGNOSIS — Z1159 Encounter for screening for other viral diseases: Secondary | ICD-10-CM

## 2022-02-27 DIAGNOSIS — M25469 Effusion, unspecified knee: Secondary | ICD-10-CM | POA: Insufficient documentation

## 2022-02-27 DIAGNOSIS — S83232D Complex tear of medial meniscus, current injury, left knee, subsequent encounter: Secondary | ICD-10-CM

## 2022-02-27 DIAGNOSIS — M25562 Pain in left knee: Secondary | ICD-10-CM | POA: Insufficient documentation

## 2022-02-27 DIAGNOSIS — Z13220 Encounter for screening for lipoid disorders: Secondary | ICD-10-CM

## 2022-02-27 DIAGNOSIS — M816 Localized osteoporosis [Lequesne]: Secondary | ICD-10-CM

## 2022-02-27 DIAGNOSIS — G8929 Other chronic pain: Secondary | ICD-10-CM | POA: Insufficient documentation

## 2022-02-27 DIAGNOSIS — R7303 Prediabetes: Secondary | ICD-10-CM | POA: Diagnosis not present

## 2022-02-27 MED ORDER — ALENDRONATE SODIUM 70 MG PO TABS: 70.0000 mg | ORAL_TABLET | ORAL | 1 refills | Status: DC

## 2022-02-27 NOTE — Patient Instructions (Signed)
It was great seeing you today!  Plan discussed at today's visit: -Blood work ordered today, results will be uploaded to MyChart.  -Knee x-ray today -Stop Voltaren pills due to swelling, start Voltaren gel to rub on knee -Continue brace  Follow up in: 2 weeks for knee follow up, 6 months for regular follow up  Take care and let us know if you have any questions or concerns prior to your next visit.  Dr. Caralee Ates

## 2022-02-27 NOTE — Progress Notes (Signed)
Established Patient Office Visit  Subjective:  Patient ID: Jade Reese, female    DOB: 1956-05-14  Age: 66 y.o. MRN: [id number]  CC:  Chief Complaint  Patient presents with   Follow-up   Knee Pain    Left after walking today    HPI Jade Reese presents for follow up.   KNEE PAIN Duration:  years  - chronic but worse over the last few weeks  Involved knee: left Mechanism of injury: unknown Location:medial Onset: sudden Severity: severe  Quality:  dull Frequency: intermittent Radiation: no Aggravating factors: weight bearing and walking  Alleviating factors: NSAIDs and rest  Status: worse Treatments attempted: Voltaren PO - has been taking daily for the last few days, does help with the pain but has been causing facial swelling. Denies tongue or mouth swelling. Denies shortness of breath.  Relief with NSAIDs?:  moderate Weakness with weight bearing or walking: yes Sensation of giving way: yes Locking: yes Popping: yes Bruising: no Swelling: no Redness: no Paresthesias/decreased sensation: no Fevers: no  Osteoporosis: -Per DEXA 7/11 lumbar spine -3.3, left hip -1.9 -Taking Fosamax weekly since August 2022 -Taking Vitamin D as well, uncertain of dose -Vitamin D levels in 7/22 low/normal at 30.3   Chronic Cough Post-COVID: -Tested positive for COVID 07/12/21, treated with anti-virals -Resolved.  HLD: -Medications: None -Last lipid panel: 7/22 TC 207, triglycerides 135, LDL 120, HDL 60  The 10-year ASCVD risk score (Arnett DK, et al., 2019) is: 5.1%   Values used to calculate the score:     Age: 66 years     Sex: Female     Is Non-Hispanic African American: No     Diabetic: No     Tobacco smoker: No     Systolic Blood Pressure: 116 mmHg     Is BP treated: No     HDL Cholesterol: 60 mg/dL     Total Cholesterol: 207 mg/dL  Health Maintenance: -Blood work Due -Mammogram/DEXA: Mammogram last summer, plan to schedule again this summer. DEXA results  above.  -Colon cancer screening: Cologuard 3/23 negative    Past Medical History:  Diagnosis Date   Arthritis    Osteoporosis    Vaginal atrophy     No family history on file.  Social History   Socioeconomic History   Marital status: Married    Spouse name: Not on file   Number of children: Not on file   Years of education: Not on file   Highest education level: Not on file  Occupational History   Not on file  Tobacco Use   Smoking status: Never   Smokeless tobacco: Never  Vaping Use   Vaping Use: Never used  Substance and Sexual Activity   Alcohol use: Never   Drug use: Never   Sexual activity: Yes  Other Topics Concern   Not on file  Social History Narrative   Not on file   Social Determinants of Health   Financial Resource Strain: Low Risk  (11/03/2021)   Overall Financial Resource Strain (CARDIA)    Difficulty of Paying Living Expenses: Not hard at all  Food Insecurity: No Food Insecurity (11/03/2021)   Hunger Vital Sign    Worried About Running Out of Food in the Last Year: Never true    Ran Out of Food in the Last Year: Never true  Transportation Needs: No Transportation Needs (11/03/2021)   PRAPARE - Transportation    Lack of Transportation (Medical): No    Lack  of Transportation (Non-Medical): No  Physical Activity: Sufficiently Active (11/03/2021)   Exercise Vital Sign    Days of Exercise per Week: 6 days    Minutes of Exercise per Session: 60 min  Stress: No Stress Concern Present (11/03/2021)   [employer] of Occupational Health - Occupational Stress Questionnaire    Feeling of Stress : Not at all  Social Connections: Moderately Isolated (11/03/2021)   Social Connection and Isolation Panel [NHANES]    Frequency of Communication with Friends and Family: More than three times a week    Frequency of Social Gatherings with Friends and Family: Twice a week    Attends Religious Services: Never    Database administrator or Organizations: No     Attends Banker Meetings: Never    Marital Status: Married  Catering manager Violence: Not At Risk (11/03/2021)   Humiliation, Afraid, Rape, and Kick questionnaire    Fear of Current or Ex-Partner: No    Emotionally Abused: No    Physically Abused: No    Sexually Abused: No    ROS Review of Systems  Constitutional:  Negative for chills and fever.  Eyes:  Negative for visual disturbance.  Respiratory:  Negative for cough and shortness of breath.   Cardiovascular:  Negative for chest pain.  Musculoskeletal:  Positive for arthralgias and joint swelling.  Neurological:  Negative for dizziness and headaches.    Objective:   Today's Vitals: BP 116/72   Pulse 66   Temp 98 F (36.7 C)   Resp 16   Ht 5\' 1"  (1.549 m)   Wt 133 lb (60.3 kg)   SpO2 98%   BMI 25.13 kg/m   Physical Exam Constitutional:      Appearance: Normal appearance.  HENT:     Head: Normocephalic and atraumatic.     Mouth/Throat:     Mouth: Mucous membranes are moist.     Comments: Post nasal drip present Eyes:     Conjunctiva/sclera: Conjunctivae normal.  Cardiovascular:     Rate and Rhythm: Normal rate and regular rhythm.  Pulmonary:     Effort: Pulmonary effort is normal.     Breath sounds: Normal breath sounds.  Musculoskeletal:        General: Swelling and tenderness present.     Left knee: Swelling, effusion and crepitus present. No deformity, erythema, ecchymosis, lacerations or bony tenderness. Decreased range of motion. Tenderness present over the medial joint line. No LCL laxity, MCL laxity, ACL laxity or PCL laxity.Abnormal meniscus. Normal patellar mobility.     Instability Tests: Anterior drawer test negative. Posterior drawer test negative. Medial McMurray test positive.     Right lower leg: No edema.     Left lower leg: No edema.     Comments: Medial effusion, tenderness over medial joint line with positive medial McMurray's, decreased ROM.  Skin:    General: Skin is warm and  dry.  Neurological:     General: No focal deficit present.     Mental Status: She is alert. Mental status is at baseline.  Psychiatric:        Mood and Affect: Mood normal.        Behavior: Behavior normal.     Assessment & Plan:   1. Chronic pain of left knee/Knee swelling: Chronic but worse over the last several weeks, Very tender, swollen. Cannot bear full weight on the joint. X-ray today but concerns about medial meniscus. Facial swelling with oral Diclofenac, discontinued, Can use topical Voltaren  gel for pain and Tylenol. Follow up in 2 weeks to recheck, consider MRI at that time.   - CBC w/Diff/Platelet - COMPLETE METABOLIC PANEL WITH GFR - DG Knee Complete 4 Views Left; Future  2.  Localized osteoporosis without current pathological fracture: Stable, continue Fosamax 70 mg weekly, refilled today.   - alendronate (FOSAMAX) 70 MG tablet; Take 1 tablet (70 mg total) by mouth once a week.  Dispense: 12 tablet; Refill: 1  3. Lipid screening/Need for hepatitis C screening test: Screening due.   - Lipid Profile - Hepatitis C Antibody   Follow-up: Return in about 6 months (around 08/30/2022) for 2 weeks for follow up on knee, 6 months for regular follow up.   Margarita Mail, DO

## 2022-02-28 LAB — CBC WITH DIFFERENTIAL/PLATELET
Absolute Monocytes: 310 cells/uL (ref 200–950)
Basophils Absolute: 30 cells/uL (ref 0–200)
Basophils Relative: 0.7 %
Eosinophils Absolute: 69 cells/uL (ref 15–500)
Eosinophils Relative: 1.6 %
HCT: 38.4 % (ref 35.0–45.0)
Hemoglobin: 12.8 g/dL (ref 11.7–15.5)
Lymphs Abs: 1677 cells/uL (ref 850–3900)
MCH: 31.1 pg (ref 27.0–33.0)
MCHC: 33.3 g/dL (ref 32.0–36.0)
MCV: 93.4 fL (ref 80.0–100.0)
MPV: 10.4 fL (ref 7.5–12.5)
Monocytes Relative: 7.2 %
Neutro Abs: 2215 cells/uL (ref 1500–7800)
Neutrophils Relative %: 51.5 %
Platelets: 215 10*3/uL (ref 140–400)
RBC: 4.11 10*6/uL (ref 3.80–5.10)
RDW: 11.7 % (ref 11.0–15.0)
Total Lymphocyte: 39 %
WBC: 4.3 10*3/uL (ref 3.8–10.8)

## 2022-02-28 LAB — COMPLETE METABOLIC PANEL WITH GFR
AG Ratio: 2.1 (calc) (ref 1.0–2.5)
ALT: 18 U/L (ref 6–29)
AST: 21 U/L (ref 10–35)
Albumin: 4.4 g/dL (ref 3.6–5.1)
Alkaline phosphatase (APISO): 107 U/L (ref 37–153)
BUN: 21 mg/dL (ref 7–25)
CO2: 23 mmol/L (ref 20–32)
Calcium: 9 mg/dL (ref 8.6–10.4)
Chloride: 109 mmol/L (ref 98–110)
Creat: 0.68 mg/dL (ref 0.50–1.05)
Globulin: 2.1 g/dL (calc) (ref 1.9–3.7)
Glucose, Bld: 97 mg/dL (ref 65–99)
Potassium: 4.5 mmol/L (ref 3.5–5.3)
Sodium: 141 mmol/L (ref 135–146)
Total Bilirubin: 0.4 mg/dL (ref 0.2–1.2)
Total Protein: 6.5 g/dL (ref 6.1–8.1)
eGFR: 96 mL/min/{1.73_m2} (ref >=60)

## 2022-02-28 LAB — LIPID PANEL
Cholesterol: 204 mg/dL — ABNORMAL HIGH (ref <200)
HDL: 62 mg/dL (ref >=50)
LDL Cholesterol (Calc): 110 mg/dL (calc) — ABNORMAL HIGH
Non-HDL Cholesterol (Calc): 142 mg/dL (calc) — ABNORMAL HIGH (ref <130)
Total CHOL/HDL Ratio: 3.3 (calc) (ref <5.0)
Triglycerides: 204 mg/dL — ABNORMAL HIGH (ref <150)

## 2022-02-28 LAB — HEPATITIS C ANTIBODY: Hepatitis C Ab: NONREACTIVE

## 2022-03-06 NOTE — Addendum Note (Signed)
Addended by: Margarita Mail on: 03/06/2022 11:41 AM   Modules accepted: Orders

## 2022-03-07 ENCOUNTER — Telehealth: Payer: Self-pay | Admitting: Internal Medicine

## 2022-03-07 NOTE — Telephone Encounter (Signed)
Irish Lack with [employer] mg[employer] calling saying the insurance is requiring more documentation for advanced imaging of the patients knee.  They would like a call back to this number.  CB@  [phone]

## 2022-03-08 ENCOUNTER — Telehealth: Payer: Self-pay | Admitting: Internal Medicine

## 2022-03-08 DIAGNOSIS — G8929 Other chronic pain: Secondary | ICD-10-CM

## 2022-03-08 NOTE — Telephone Encounter (Addendum)
Medication Refill - Medication: diclofenac (VOLTAREN) 75 MG EC tablet  Has the patient contacted their pharmacy? Yes.     Preferred Pharmacy (with phone number or street name):  [facility] [address] [place], [place] - [address] Phone:  [phone]  Fax:  [phone]     Has the patient been seen for an appointment in the last year OR does the patient have an upcoming appointment? Yes.    It looks as though the med shows as discontinued on file but the spouse called in stating his wife prefers to take this instead of the cream. He has already contacted the pharmacy but did not hear back from them. Please assist patient further

## 2022-03-08 NOTE — Telephone Encounter (Signed)
Tried to call left vm 

## 2022-03-08 NOTE — Telephone Encounter (Signed)
Requested Prescriptions  Pending Prescriptions Disp Refills  . diclofenac (VOLTAREN) 75 MG EC tablet 30 tablet 0    Sig: Take 1 tablet (75 mg total) by mouth 2 (two) times daily.     Analgesics:  NSAIDS Failed - 03/08/2022  2:15 PM      Failed - Manual Review: Labs are only required if the patient has taken medication for more than 8 weeks.      Passed - Cr in normal range and within 360 days    Creat  Date Value Ref Range Status  02/27/2022 0.68 0.50 - 1.05 mg/dL Final         Passed - HGB in normal range and within 360 days    Hemoglobin  Date Value Ref Range Status  02/27/2022 12.8 11.7 - 15.5 g/dL Final         Passed - PLT in normal range and within 360 days    Platelets  Date Value Ref Range Status  02/27/2022 215 140 - 400 Thousand/uL Final         Passed - HCT in normal range and within 360 days    HCT  Date Value Ref Range Status  02/27/2022 38.4 35.0 - 45.0 % Final         Passed - eGFR is 30 or above and within 360 days    eGFR  Date Value Ref Range Status  02/27/2022 96 > OR = 60 mL/min/1.37m Final         Passed - Patient is not pregnant      Passed - Valid encounter within last 12 months    Recent Outpatient Visits          1 week ago Chronic pain of left knee   [facility]   [facility] weeks [facility]   6 months [facility]      Future App[facility]ments            [facility]  [facility]

## 2022-03-10 NOTE — Telephone Encounter (Signed)
Pt's husband calling. Pt is wanting to continue with the voltaren pill instead of the gel d/t having to apply it often. He states pt wasn't taking it every day and doesn't feel like the facial swelling was serious or possibly related to that medication. They are willing to monitor symptoms and report if swelling was to get worse. I advised that I would send message back and have someone fu with her. CB [phone]

## 2022-03-14 ENCOUNTER — Ambulatory Visit
Admission: RE | Admit: 2022-03-14 | Discharge: 2022-03-14 | Disposition: A | Discharge status: Home / Self Care | Payer: Medicare Other | Source: Ambulatory Visit | Attending: Internal Medicine | Admitting: Internal Medicine

## 2022-03-14 DIAGNOSIS — G8929 Other chronic pain: Secondary | ICD-10-CM | POA: Diagnosis not present

## 2022-03-14 DIAGNOSIS — S83272A Complex tear of lateral meniscus, current injury, left knee, initial encounter: Secondary | ICD-10-CM | POA: Diagnosis not present

## 2022-03-14 DIAGNOSIS — M25469 Effusion, unspecified knee: Secondary | ICD-10-CM | POA: Diagnosis not present

## 2022-03-14 DIAGNOSIS — M25562 Pain in left knee: Secondary | ICD-10-CM | POA: Diagnosis not present

## 2022-03-15 NOTE — Telephone Encounter (Signed)
Wants to know what other options beside gel?

## 2022-03-16 ENCOUNTER — Other Ambulatory Visit: Payer: Self-pay | Admitting: Internal Medicine

## 2022-03-16 DIAGNOSIS — S83232D Complex tear of medial meniscus, current injury, left knee, subsequent encounter: Secondary | ICD-10-CM

## 2022-03-16 NOTE — Addendum Note (Signed)
Addended by: Margarita Mail on: 03/16/2022 12:45 PM   Modules accepted: Orders

## 2022-03-20 ENCOUNTER — Ambulatory Visit (INDEPENDENT_AMBULATORY_CARE_PROVIDER_SITE_OTHER): Payer: Medicare Other | Admitting: Internal Medicine

## 2022-03-20 ENCOUNTER — Encounter: Payer: Self-pay | Admitting: Internal Medicine

## 2022-03-20 VITALS — BP 118/72 | HR 71 | Temp 98.1°F | Resp 16 | Ht 61.0 in | Wt 130.2 lb

## 2022-03-20 DIAGNOSIS — S83232D Complex tear of medial meniscus, current injury, left knee, subsequent encounter: Secondary | ICD-10-CM

## 2022-03-20 DIAGNOSIS — R34 Anuria and oliguria: Secondary | ICD-10-CM

## 2022-03-20 DIAGNOSIS — M2550 Pain in unspecified joint: Secondary | ICD-10-CM | POA: Diagnosis not present

## 2022-03-20 LAB — POCT URINALYSIS DIPSTICK
Bilirubin, UA: NEGATIVE
Blood, UA: NEGATIVE
Glucose, UA: NEGATIVE
Ketones, UA: NEGATIVE
Leukocytes, UA: NEGATIVE
Nitrite, UA: NEGATIVE
Odor: NORMAL
Protein, UA: NEGATIVE
Spec Grav, UA: 1.02 (ref 1.010–1.025)
Urobilinogen, UA: 0.2 E.U./dL
pH, UA: 5.5 (ref 5.0–8.0)

## 2022-03-20 NOTE — Progress Notes (Signed)
Established Patient Office Visit  Subjective:  Patient ID: Jade Reese, female    DOB: 1955-12-08  Age: 66 y.o. MRN: [id number]  CC:  Chief Complaint  Patient presents with   Follow-up    Knee pain    HPI [name] presents for follow up on knee pain.   KNEE PAIN Duration:  years  - chronic but worse over the last few weeks  Involved knee: left Mechanism of injury: unknown Location:medial Onset: sudden Severity: severe  Quality:  dull Frequency: intermittent Radiation: no Aggravating factors: weight bearing and walking  Alleviating factors: NSAIDs and rest  Status: worse Treatments attempted: Voltaren PO - has been taking daily for the last few days, does help with the pain but has been causing facial swelling. Denies tongue or mouth swelling. Denies shortness of breath.  Relief with NSAIDs?:  moderate Weakness with weight bearing or walking: yes Sensation of giving way: yes Locking: yes Popping: yes Bruising: no Swelling: no Redness: no Paresthesias/decreased sensation: no Fevers: no -MRI without contrast 03/14/22: complex degenerative tear of the body/posterior horn of the medial meniscus with tricompartmental OA and joint effusion.  -Referral placed to both Orthopedics and PT - hasn't been seen yet.  -Now saying she does have multiple joint pains, including her hands and wrists. She is adopted and is uncertain about any family history of autoimmune or inflammatory disease.   URINARY SYMPTOMS Dysuria: no Urinary frequency: no Urgency: no Small volume voids: yes Urinary incontinence: no Foul odor: no Hematuria: no Abdominal pain: no Says her previous doctor had her on a vaginal gel to help with UTI's, I do see vaginal estrogen ordered previously and Azo.   Osteoporosis: -Per DEXA 7/11 lumbar spine -3.3, left hip -1.9 -Taking Fosamax weekly since August 2022 -Taking Vitamin D as well, uncertain of dose -Vitamin D levels in 7/22 low/normal at 30.3    HLD: -Medications: None -Last lipid panel: 7/22 TC 207, triglycerides 135, LDL 120, HDL 60  The 10-year ASCVD risk score (Arnett DK, et al., 2019) is: 5.1%   Values used to calculate the score:     Age: 67 years     Sex: Female     Is Non-Hispanic African American: No     Diabetic: No     Tobacco smoker: No     Systolic Blood Pressure: 662 mmHg     Is BP treated: No     HDL Cholesterol: 62 mg/dL     Total Cholesterol: 204 mg/dL  Health Maintenance: -Blood work Due -Mammogram/DEXA: Mammogram last summer, plan to schedule again this summer. DEXA results above.  -Colon cancer screening: Cologuard 3/23 negative    Past Medical History:  Diagnosis Date   Arthritis    Osteoporosis    Vaginal atrophy     History reviewed. No pertinent family history.  Social History   Socioeconomic History   Marital status: Married    Spouse name: Not on file   Number of children: Not on file   Years of education: Not on file   Highest education level: Not on file  Occupational History   Not on file  Tobacco Use   Smoking status: Never   Smokeless tobacco: Never  Vaping Use   Vaping Use: Never used  Substance and Sexual Activity   Alcohol use: Never   Drug use: Never   Sexual activity: Yes  Other Topics Concern   Not on file  Social History Narrative   Not on file  Social Determinants of Health   Financial Resource Strain: Low Risk  (11/03/2021)   Overall Financial Resource Strain (CARDIA)    Difficulty of Paying Living Expenses: Not hard at all  Food Insecurity: No Food Insecurity (11/03/2021)   Hunger Vital Sign    Worried About Running Out of Food in the Last Year: Never true    Ran Out of Food in the Last Year: Never true  Transportation Needs: No Transportation Needs (11/03/2021)   PRAPARE - Hydrologist (Medical): No    Lack of Transportation (Non-Medical): No  Physical Activity: Sufficiently Active (11/03/2021)   Exercise Vital Sign     Days of Exercise per Week: 6 days    Minutes of Exercise per Session: 60 min  Stress: No Stress Concern Present (11/03/2021)   [place]    Feeling of Stress : Not at all  Social Connections: Moderately Isolated (11/03/2021)   Social Connection and Isolation Panel [NHANES]    Frequency of Communication with Friends and Family: More than three times a week    Frequency of Social Gatherings with Friends and Family: Twice a week    Attends Religious Services: Never    Marine scientist or Organizations: No    Attends Archivist Meetings: Never    Marital Status: Married  Human resources officer Violence: Not At Risk (11/03/2021)   Humiliation, Afraid, Rape, and Kick questionnaire    Fear of Current or Ex-Partner: No    Emotionally Abused: No    Physically Abused: No    Sexually Abused: No    ROS Review of Systems  Constitutional:  Negative for chills and fever.  Eyes:  Negative for visual disturbance.  Respiratory:  Negative for cough and shortness of breath.   Cardiovascular:  Negative for chest pain.  Genitourinary:  Positive for decreased urine volume. Negative for flank pain, frequency and hematuria.  Musculoskeletal:  Positive for arthralgias and joint swelling.  Skin: Negative.   Neurological:  Negative for dizziness and headaches.    Objective:   Today's Vitals: BP 118/72   Pulse 71   Temp 98.1 F (36.7 C)   Resp 16   Ht '5\' 1"'  (1.549 m)   Wt 130 lb 3.2 oz (59.1 kg)   SpO2 98%   BMI 24.60 kg/m   Physical Exam Constitutional:      Appearance: Normal appearance.  HENT:     Head: Normocephalic and atraumatic.     Mouth/Throat:     Comments: Post nasal drip present Eyes:     Conjunctiva/sclera: Conjunctivae normal.  Cardiovascular:     Rate and Rhythm: Normal rate and regular rhythm.  Pulmonary:     Effort: Pulmonary effort is normal.     Breath sounds: Normal breath sounds.   Musculoskeletal:        General: Swelling and tenderness present.     Right lower leg: No edema.     Left lower leg: No edema.  Skin:    General: Skin is warm and dry.  Neurological:     General: No focal deficit present.     Mental Status: She is alert. Mental status is at baseline.  Psychiatric:        Mood and Affect: Mood normal.        Behavior: Behavior normal.     Assessment & Plan:   1. Complex tear of medial meniscus of left knee, unspecified whether old or current  tear, subsequent encounter: Reviewed MRI with the patient, showing medial meniscus tear and OA with joint effusion. Patient to see Orthopedics and PT. Recommend continuing to wear brace. Patient had been taking oral Voltaren for pain but this caused mouth swelling. I recommend she avoid that medication and continue to take Tylenol and topical Voltaren for pain relief.   2. Pain in joint, multiple sites: Work up for RA.   - Antinuclear Antib (ANA) - Sed Rate (ESR) - C-reactive protein  3. Urine output low: Urine dipstick today normal, no infection.    - POCT Urinalysis Dipstick   Follow-up: Return for already scheduled .   Teodora Medici, DO

## 2022-03-20 NOTE — Patient Instructions (Addendum)
It was great seeing you today!  Plan discussed at today's visit: -Blood work ordered today, results will be uploaded to MyChart.  -Urine sample today -MRI showing tear in the medial meniscus and degenerative arthritis. Referral to Orthopedic doctor placed and for physical therapy  Follow up in: already scheduled   Take care and let us know if you have any questions or concerns prior to your next visit.  Dr. Caralee Ates

## 2022-03-21 LAB — C-REACTIVE PROTEIN: CRP: 1.9 mg/L (ref <8.0)

## 2022-03-21 LAB — SEDIMENTATION RATE: Sed Rate: 6 mm/h (ref 0–30)

## 2022-03-21 LAB — ANA: Anti Nuclear Antibody (ANA): NEGATIVE

## 2022-03-27 DIAGNOSIS — M1712 Unilateral primary osteoarthritis, left knee: Secondary | ICD-10-CM | POA: Diagnosis not present

## 2022-04-19 ENCOUNTER — Ambulatory Visit: Payer: Medicare Other | Admitting: Internal Medicine

## 2022-04-19 NOTE — Progress Notes (Deleted)
   Acute Office Visit  Subjective:     Patient ID: Jade Reese, female    DOB: 08-13-1955, 66 y.o.   MRN: [id number]  No chief complaint on file.   HPI Patient is in today for varicose veins.   ROS      Objective:    There were no vitals taken for this visit. {Vitals History (Optional):23777}  Physical Exam  No results found for any visits on 04/19/22.      Assessment & Plan:   Problem List Items Addressed This Visit   None   No orders of the defined types were placed in this encounter.   No follow-ups on file.  Teodora Medici, DO

## 2022-04-24 ENCOUNTER — Ambulatory Visit (INDEPENDENT_AMBULATORY_CARE_PROVIDER_SITE_OTHER): Payer: Medicare Other | Admitting: Internal Medicine

## 2022-04-24 ENCOUNTER — Encounter: Payer: Self-pay | Admitting: Internal Medicine

## 2022-04-24 VITALS — BP 124/84 | HR 100 | Temp 97.7°F | Resp 16 | Wt 132.2 lb

## 2022-04-24 DIAGNOSIS — R202 Paresthesia of skin: Secondary | ICD-10-CM | POA: Diagnosis not present

## 2022-04-24 DIAGNOSIS — R200 Anesthesia of skin: Secondary | ICD-10-CM

## 2022-04-24 NOTE — Progress Notes (Signed)
   Acute Office Visit  Subjective:     Patient ID: Jade Reese, female    DOB: September 11, 1955, 66 y.o.   MRN: [id number]  Chief Complaint  Patient presents with   Numbness    In toes    HPI Patient is in today for numbness in her feet.  She had seen Ortho for her knee, and is participating in physical therapy exercise program.  She was started on Celebrex, which does help with her knee pain but she is having some mild knee swelling.  NUMBNESS Duration:  few weeks ago after she started PT for knee - having pain and numbness  Onset: gradual Location: second on third digits on left foot but only on the sides Bilateral: no Symmetric: no Decreased sensation: yes  Weakness: no Pain: yes Quality: Feels like toes are falling asleep Severity: mild  Frequency: occasional Trauma: no Recent illness: no Diabetes: no Thyroid disease: no   Review of Systems  All other systems reviewed and are negative.       Objective:    BP 124/84   Pulse 100   Temp 97.7 F (36.5 C)   Resp 16   Wt 132 lb 3.2 oz (60 kg)   SpO2 100%   BMI 24.98 kg/m  BP Readings from Last 3 Encounters:  04/24/22 124/84  03/20/22 118/72  02/27/22 116/72   Wt Readings from Last 3 Encounters:  04/24/22 132 lb 3.2 oz (60 kg)  03/20/22 130 lb 3.2 oz (59.1 kg)  02/27/22 133 lb (60.3 kg)      Physical Exam Constitutional:      Appearance: Normal appearance.  HENT:     Head: Normocephalic and atraumatic.  Eyes:     Conjunctiva/sclera: Conjunctivae normal.  Cardiovascular:     Rate and Rhythm: Normal rate and regular rhythm.  Pulmonary:     Effort: Pulmonary effort is normal.     Breath sounds: Normal breath sounds.  Musculoskeletal:     Right lower leg: No edema.     Left lower leg: No edema.  Skin:    General: Skin is warm and dry.     Comments: Mild varicose veins on left lower extremity, no swelling  Neurological:     General: No focal deficit present.     Mental Status: She is alert. Mental  status is at baseline.  Psychiatric:        Mood and Affect: Mood normal.        Behavior: Behavior normal.     No results found for any visits on 04/24/22.      Assessment & Plan:   1. Numbness and tingling of foot: She is occasionally having second and third digit numbness on the toes of her left lower extremity but only on the sides of the toes.  Foot exam normal today.  Symptoms are worse after doing exercises, particularly when she is walking on tiptoes and using her calf muscles.  She is taking Celebrex, which does help with pain.  She is on a multivitamin as well as vitamin D.  Discussed supportive footwear while doing exercises.  I do not think symptoms are severe enough for EMG, labs have all been normal.  Patient will monitor symptoms and let me know if they progress although I do recommend she continue with her physical therapy program.  Return for already scheduled .  Teodora Medici, DO

## 2022-04-24 NOTE — Patient Instructions (Addendum)
It was great seeing you today!  Plan discussed at today's visit: -Continue exercises, be sure to have supportive footwear and continue vitamins  Follow up in: already sscheduled  Take care and let us know if you have any questions or concerns prior to your next visit.  Dr. Rosana Berger

## 2022-05-08 DIAGNOSIS — M1712 Unilateral primary osteoarthritis, left knee: Secondary | ICD-10-CM | POA: Diagnosis not present

## 2022-07-12 NOTE — Progress Notes (Unsigned)
Established Patient Office Visit  Subjective:  Patient ID: Jade Reese, female    DOB: May 05, 1956  Age: 67 y.o. MRN: [id number]  CC:  No chief complaint on file.   HPI Jade Reese presents for concerns about blood pressure.    Osteoporosis: -Per DEXA 7/11 lumbar spine -3.3, left hip -1.9 -Taking Fosamax weekly since August 2022 -Taking Vitamin D as well, uncertain of dose -Vitamin D levels in 7/22 low/normal at 30.3   HLD: -Medications: None -Last lipid panel: Lipid Panel     Component Value Date/Time   CHOL 204 (H) 02/27/2022 1408   TRIG 204 (H) 02/27/2022 1408   HDL 62 02/27/2022 1408   CHOLHDL 3.3 02/27/2022 1408   LDLCALC 110 (H) 02/27/2022 1408    The 10-year ASCVD risk score (Arnett DK, et al., 2019) is: 6.3%   Values used to calculate the score:     Age: 64 years     Sex: Female     Is Non-Hispanic African American: No     Diabetic: No     Tobacco smoker: No     Systolic Blood Pressure: 355 mmHg     Is BP treated: No     HDL Cholesterol: 62 mg/dL     Total Cholesterol: 204 mg/dL  Health Maintenance: -Blood work UTD -Mammogram/DEXA: 8/22, due  -Colon cancer screening: Cologuard 3/23 negative    Past Medical History:  Diagnosis Date   Arthritis    Osteoporosis    Vaginal atrophy     No family history on file.  Social History   Socioeconomic History   Marital status: Married    Spouse name: Not on file   Number of children: Not on file   Years of education: Not on file   Highest education level: Not on file  Occupational History   Not on file  Tobacco Use   Smoking status: Never   Smokeless tobacco: Never  Vaping Use   Vaping Use: Never used  Substance and Sexual Activity   Alcohol use: Never   Drug use: Never   Sexual activity: Yes  Other Topics Concern   Not on file  Social History Narrative   Not on file   Social Determinants of Health   Financial Resource Strain: Low Risk  (11/03/2021)   Overall Financial Resource  Strain (CARDIA)    Difficulty of Paying Living Expenses: Not hard at all  Food Insecurity: No Food Insecurity (11/03/2021)   Hunger Vital Sign    Worried About Running Out of Food in the Last Year: Never true    [place] in the Last Year: Never true  Transportation Needs: No Transportation Needs (11/03/2021)   PRAPARE - Hydrologist (Medical): No    Lack of Transportation (Non-Medical): No  Physical Activity: Sufficiently Active (11/03/2021)   Exercise Vital Sign    Days of Exercise per Week: 6 days    Minutes of Exercise per Session: 60 min  Stress: No Stress Concern Present (11/03/2021)   [place]    Feeling of Stress : Not at all  Social Connections: Moderately Isolated (11/03/2021)   Social Connection and Isolation Panel [NHANES]    Frequency of Communication with Friends and Family: More than three times a week    Frequency of Social Gatherings with Friends and Family: Twice a week    Attends Religious Services: Never    Active Member of  Clubs or Organizations: No    Attends Archivist Meetings: Never    Marital Status: Married  Human resources officer Violence: Not At Risk (11/03/2021)   Humiliation, Afraid, Rape, and Kick questionnaire    Fear of Current or Ex-Partner: No    Emotionally Abused: No    Physically Abused: No    Sexually Abused: No    ROS Review of Systems  Constitutional:  Negative for chills and fever.  Eyes:  Negative for visual disturbance.  Respiratory:  Negative for cough and shortness of breath.   Cardiovascular:  Negative for chest pain.  Genitourinary:  Positive for decreased urine volume. Negative for flank pain, frequency and hematuria.  Musculoskeletal:  Positive for arthralgias and joint swelling.  Skin: Negative.   Neurological:  Negative for dizziness and headaches.    Objective:   Today's Vitals: There were no vitals taken for this  visit.  Physical Exam Constitutional:      Appearance: Normal appearance.  HENT:     Head: Normocephalic and atraumatic.     Mouth/Throat:     Comments: Post nasal drip present Eyes:     Conjunctiva/sclera: Conjunctivae normal.  Cardiovascular:     Rate and Rhythm: Normal rate and regular rhythm.  Pulmonary:     Effort: Pulmonary effort is normal.     Breath sounds: Normal breath sounds.  Musculoskeletal:        General: Swelling and tenderness present.     Right lower leg: No edema.     Left lower leg: No edema.  Skin:    General: Skin is warm and dry.  Neurological:     General: No focal deficit present.     Mental Status: She is alert. Mental status is at baseline.  Psychiatric:        Mood and Affect: Mood normal.        Behavior: Behavior normal.     Assessment & Plan:   1. Complex tear of medial meniscus of left knee, unspecified whether old or current tear, subsequent encounter: Reviewed MRI with the patient, showing medial meniscus tear and OA with joint effusion. Patient to see Orthopedics and PT. Recommend continuing to wear brace. Patient had been taking oral Voltaren for pain but this caused mouth swelling. I recommend she avoid that medication and continue to take Tylenol and topical Voltaren for pain relief.   2. Pain in joint, multiple sites: Work up for RA.   - Antinuclear Antib (ANA) - Sed Rate (ESR) - C-reactive protein  3. Urine output low: Urine dipstick today normal, no infection.    - POCT Urinalysis Dipstick   Follow-up: No follow-ups on file.   Teodora Medici, DO

## 2022-07-13 ENCOUNTER — Encounter: Payer: Self-pay | Admitting: Internal Medicine

## 2022-07-13 ENCOUNTER — Ambulatory Visit (INDEPENDENT_AMBULATORY_CARE_PROVIDER_SITE_OTHER): Payer: Medicare Other | Admitting: Internal Medicine

## 2022-07-13 VITALS — BP 138/88 | HR 76 | Temp 98.0°F | Resp 16 | Ht 61.0 in | Wt 133.2 lb

## 2022-07-13 DIAGNOSIS — R5383 Other fatigue: Secondary | ICD-10-CM | POA: Diagnosis not present

## 2022-07-13 DIAGNOSIS — R110 Nausea: Secondary | ICD-10-CM | POA: Diagnosis not present

## 2022-07-13 DIAGNOSIS — K219 Gastro-esophageal reflux disease without esophagitis: Secondary | ICD-10-CM

## 2022-07-13 DIAGNOSIS — R1013 Epigastric pain: Secondary | ICD-10-CM

## 2022-07-13 MED ORDER — PANTOPRAZOLE SODIUM 40 MG PO TBEC: 40.0000 mg | DELAYED_RELEASE_TABLET | Freq: Every day | ORAL | 1 refills | Status: DC

## 2022-07-13 MED ORDER — ONDANSETRON 8 MG PO TBDP: 8.0000 mg | ORAL_TABLET | Freq: Three times a day (TID) | ORAL | 0 refills | Status: DC | PRN

## 2022-07-13 NOTE — Patient Instructions (Signed)
It was great seeing you today!  Plan discussed at today's visit: -Blood work ordered today, results will be uploaded to [facility]. Please return tomorrow from 8-11 for labs -EKG normal -Take Protonix 40 mg daily to acid reflux  -Take Zofran as needed for nausea   Follow up in: February, already scheduled   Take care and let us know if you have any questions or concerns prior to your next visit.  Dr. Rosana Berger

## 2022-07-14 DIAGNOSIS — R5383 Other fatigue: Secondary | ICD-10-CM | POA: Diagnosis not present

## 2022-07-14 DIAGNOSIS — R1013 Epigastric pain: Secondary | ICD-10-CM | POA: Diagnosis not present

## 2022-07-15 LAB — COMPLETE METABOLIC PANEL WITH GFR
AG Ratio: 1.8 (calc) (ref 1.0–2.5)
ALT: 11 U/L (ref 6–29)
AST: 16 U/L (ref 10–35)
Albumin: 4.2 g/dL (ref 3.6–5.1)
Alkaline phosphatase (APISO): 83 U/L (ref 37–153)
BUN: 22 mg/dL (ref 7–25)
CO2: 28 mmol/L (ref 20–32)
Calcium: 9.1 mg/dL (ref 8.6–10.4)
Chloride: 105 mmol/L (ref 98–110)
Creat: 0.69 mg/dL (ref 0.50–1.05)
Globulin: 2.3 g/dL (calc) (ref 1.9–3.7)
Glucose, Bld: 101 mg/dL — ABNORMAL HIGH (ref 65–99)
Potassium: 4.6 mmol/L (ref 3.5–5.3)
Sodium: 141 mmol/L (ref 135–146)
Total Bilirubin: 0.6 mg/dL (ref 0.2–1.2)
Total Protein: 6.5 g/dL (ref 6.1–8.1)
eGFR: 95 mL/min/{1.73_m2} (ref >=60)

## 2022-07-15 LAB — LIPASE: Lipase: 44 U/L (ref 7–60)

## 2022-07-15 LAB — CBC WITH DIFFERENTIAL/PLATELET
Absolute Monocytes: 418 cells/uL (ref 200–950)
Basophils Absolute: 22 cells/uL (ref 0–200)
Basophils Relative: 0.5 %
Eosinophils Absolute: 40 cells/uL (ref 15–500)
Eosinophils Relative: 0.9 %
HCT: 39.3 % (ref 35.0–45.0)
Hemoglobin: 13.4 g/dL (ref 11.7–15.5)
Lymphs Abs: 1492 cells/uL (ref 850–3900)
MCH: 31 pg (ref 27.0–33.0)
MCHC: 34.1 g/dL (ref 32.0–36.0)
MCV: 91 fL (ref 80.0–100.0)
MPV: 10.6 fL (ref 7.5–12.5)
Monocytes Relative: 9.5 %
Neutro Abs: 2429 cells/uL (ref 1500–7800)
Neutrophils Relative %: 55.2 %
Platelets: 201 10*3/uL (ref 140–400)
RBC: 4.32 10*6/uL (ref 3.80–5.10)
RDW: 11.6 % (ref 11.0–15.0)
Total Lymphocyte: 33.9 %
WBC: 4.4 10*3/uL (ref 3.8–10.8)

## 2022-07-15 LAB — TSH: TSH: 3.18 mIU/L (ref 0.40–4.50)

## 2022-09-03 NOTE — Progress Notes (Unsigned)
Established Patient Office Visit  Subjective:  Patient ID: Jade Reese, female    DOB: 1956/02/15  Age: 67 y.o. MRN: [id number]  CC:  No chief complaint on file.   HPI Chelsae Mcgloin presents for follow up on chronic medical conditions.    Was seen in January for epigastric pain, treated with Protonix 40 mg. Today she states that   Osteoporosis: -Per DEXA 01/17/21 lumbar spine -3.3, left hip -1.9 -Taking Fosamax weekly since August 2022 -Taking Vitamin D as well, uncertain of dose -Vitamin D levels in 7/22 low/normal at 30.3   Chronic Cough Post-COVID: -Tested positive for COVID 07/12/21, treated with anti-virals -Resolved.  HLD: -Medications: None -Last lipid panel: Lipid Panel     Component Value Date/Time   CHOL 204 (H) 02/27/2022 1408   TRIG 204 (H) 02/27/2022 1408   HDL 62 02/27/2022 1408   CHOLHDL 3.3 02/27/2022 1408   LDLCALC 110 (H) 02/27/2022 1408    The 10-year ASCVD risk score (Arnett DK, et al., 2019) is: 7.7%   Values used to calculate the score:     Age: 12 years     Sex: Female     Is Non-Hispanic African American: No     Diabetic: No     Tobacco smoker: No     Systolic Blood Pressure: 0000000 mmHg     Is BP treated: No     HDL Cholesterol: 62 mg/dL     Total Cholesterol: 204 mg/dL  Health Maintenance: -Blood work Due -Mammogram/DEXA: Mammogram last summer, plan to schedule again this summer. DEXA results above.  -Colon cancer screening: Cologuard 3/23 negative    Past Medical History:  Diagnosis Date   Arthritis    Osteoporosis    Vaginal atrophy     No family history on file.  Social History   Socioeconomic History   Marital status: Married    Spouse name: Not on file   Number of children: Not on file   Years of education: Not on file   Highest education level: Not on file  Occupational History   Not on file  Tobacco Use   Smoking status: Never   Smokeless tobacco: Never  Vaping Use   Vaping Use: Never used  Substance and  Sexual Activity   Alcohol use: Never   Drug use: Never   Sexual activity: Yes  Other Topics Concern   Not on file  Social History Narrative   Not on file   Social Determinants of Health   Financial Resource Strain: Low Risk  (11/03/2021)   Overall Financial Resource Strain (CARDIA)    Difficulty of Paying Living Expenses: Not hard at all  Food Insecurity: No Food Insecurity (11/03/2021)   Hunger Vital Sign    Worried About Running Out of Food in the Last Year: Never true    [place] in the Last Year: Never true  Transportation Needs: No Transportation Needs (11/03/2021)   PRAPARE - Hydrologist (Medical): No    Lack of Transportation (Non-Medical): No  Physical Activity: Sufficiently Active (11/03/2021)   Exercise Vital Sign    Days of Exercise per Week: 6 days    Minutes of Exercise per Session: 60 min  Stress: No Stress Concern Present (11/03/2021)   [place]    Feeling of Stress : Not at all  Social Connections: Moderately Isolated (11/03/2021)   Social Connection and Isolation Panel [NHANES]  Frequency of Communication with Friends and Family: More than three times a week    Frequency of Social Gatherings with Friends and Family: Twice a week    Attends Religious Services: Never    Marine scientist or Organizations: No    Attends Archivist Meetings: Never    Marital Status: Married  Human resources officer Violence: Not At Risk (11/03/2021)   Humiliation, Afraid, Rape, and Kick questionnaire    Fear of Current or Ex-Partner: No    Emotionally Abused: No    Physically Abused: No    Sexually Abused: No    ROS Review of Systems  Constitutional:  Negative for chills and fever.  Eyes:  Negative for visual disturbance.  Respiratory:  Negative for cough and shortness of breath.   Cardiovascular:  Negative for chest pain.  Musculoskeletal:  Positive for  arthralgias and joint swelling.  Neurological:  Negative for dizziness and headaches.    Objective:   Today's Vitals: There were no vitals taken for this visit.  Physical Exam Constitutional:      Appearance: Normal appearance.  HENT:     Head: Normocephalic and atraumatic.     Mouth/Throat:     Mouth: Mucous membranes are moist.     Comments: Post nasal drip present Eyes:     Conjunctiva/sclera: Conjunctivae normal.  Cardiovascular:     Rate and Rhythm: Normal rate and regular rhythm.  Pulmonary:     Effort: Pulmonary effort is normal.     Breath sounds: Normal breath sounds.  Musculoskeletal:        General: Swelling and tenderness present.     Left knee: Swelling, effusion and crepitus present. No deformity, erythema, ecchymosis, lacerations or bony tenderness. Decreased range of motion. Tenderness present over the medial joint line. No LCL laxity, MCL laxity, ACL laxity or PCL laxity.Abnormal meniscus. Normal patellar mobility.     Instability Tests: Anterior drawer test negative. Posterior drawer test negative. Medial McMurray test positive.     Right lower leg: No edema.     Left lower leg: No edema.     Comments: Medial effusion, tenderness over medial joint line with positive medial McMurray's, decreased ROM.  Skin:    General: Skin is warm and dry.  Neurological:     General: No focal deficit present.     Mental Status: She is alert. Mental status is at baseline.  Psychiatric:        Mood and Affect: Mood normal.        Behavior: Behavior normal.     Assessment & Plan:   1. Chronic pain of left knee/Knee swelling: Chronic but worse over the last several weeks, Very tender, swollen. Cannot bear full weight on the joint. X-ray today but concerns about medial meniscus. Facial swelling with oral Diclofenac, discontinued, Can use topical Voltaren gel for pain and Tylenol. Follow up in 2 weeks to recheck, consider MRI at that time.   - CBC w/Diff/Platelet - COMPLETE  METABOLIC PANEL WITH GFR - DG Knee Complete 4 Views Left; Future  2.  Localized osteoporosis without current pathological fracture: Stable, continue Fosamax 70 mg weekly, refilled today.   - alendronate (FOSAMAX) 70 MG tablet; Take 1 tablet (70 mg total) by mouth once a week.  Dispense: 12 tablet; Refill: 1  3. Lipid screening/Need for hepatitis C screening test: Screening due.   - Lipid Profile - Hepatitis C Antibody   Follow-up: No follow-ups on file.   Teodora Medici, DO

## 2022-09-04 ENCOUNTER — Ambulatory Visit (INDEPENDENT_AMBULATORY_CARE_PROVIDER_SITE_OTHER): Payer: Medicare Other | Admitting: Internal Medicine

## 2022-09-04 ENCOUNTER — Encounter: Payer: Self-pay | Admitting: Internal Medicine

## 2022-09-04 VITALS — BP 124/84 | HR 75 | Temp 97.8°F | Resp 16 | Ht 61.0 in | Wt 133.0 lb

## 2022-09-04 DIAGNOSIS — M1990 Unspecified osteoarthritis, unspecified site: Secondary | ICD-10-CM

## 2022-09-04 DIAGNOSIS — S39012A Strain of muscle, fascia and tendon of lower back, initial encounter: Secondary | ICD-10-CM

## 2022-09-04 DIAGNOSIS — M816 Localized osteoporosis [Lequesne]: Secondary | ICD-10-CM

## 2022-09-04 MED ORDER — ALENDRONATE SODIUM 70 MG PO TABS: 70.0000 mg | ORAL_TABLET | ORAL | 1 refills | Status: AC

## 2022-09-04 MED ORDER — CELECOXIB 100 MG PO CAPS: 100.0000 mg | ORAL_CAPSULE | Freq: Every day | ORAL | 0 refills | Status: DC

## 2022-09-04 NOTE — Patient Instructions (Addendum)
It was great seeing you today!  Plan discussed at today's visit: -Start Celebrex 100 mg once daily with food (avoid all other anti-inflammatories)  -Fosamax refilled  -Plan for blood work at follow   Follow up in: 3 months   Take care and let us know if you have any questions or concerns prior to your next visit.  Dr. Rosana Berger  Lumbar Strain A lumbar strain, which is sometimes called a low-back strain, is a stretch or tear in a muscle or tendons in the lower back (lumbar spine). Tendons are the strong cords of tissue that attach muscle to bone. This type of injury occurs when muscles or tendons are torn or are stretched beyond their limits. Lumbar strains can range from mild to severe. Mild strains may involve stretching a muscle or tendon without tearing it. These may heal in 1-2 weeks. More severe strains involve tearing of muscle fibers or tendons. These will cause more pain and may take 6-8 weeks to heal. What are the causes? This condition may be caused by: Trauma, such as a fall or a hit to the body. Twisting or overstretching the back. This may result from doing activities that take a lot of energy, such as lifting heavy objects. What increases the risk? A lumbar strain is more common in: Athletes. People with obesity. People who do repeated lifting, bending, or other movements that involve their back. What are the signs or symptoms? Symptoms of this condition may include: Sharp or dull pain in the lower back that does not go away. The pain may spread to the buttocks. Stiffness or limited range of motion. Sudden muscle tightening (spasms). How is this diagnosed? This condition may be diagnosed based on: Your symptoms. Your medical history. A physical exam. Imaging tests, such as: X-rays. MRI. How is this treated? Treatment for this condition may include: Rest. Applying heat and cold to the affected area. Over-the-counter medicines to help with pain and inflammation,  such as NSAIDs. Prescription medicine for pain or to relax the muscles. These may be needed for a short time. Physical therapy exercises to improve movement and strength. Follow these instructions at home: Managing pain, stiffness, and swelling     If told, put ice on the injured area during the first 24 hours after your injury. Put ice in a plastic bag. Place a towel between your skin and the bag. Leave the ice on for 20 minutes, 2-3 times a day. If told, apply heat to the affected area as often as told by your health care provider. Use the heat source that your health care provider recommends, such as a moist heat pack or a heating pad. Place a towel between your skin and the heat source. Leave the heat on for 20-30 minutes. If your skin turns bright red, remove the ice or heat right away to prevent skin damage. The risk of damage is higher if you cannot feel pain, heat, or cold. Activity Rest and return to your normal activities as told by your health care provider. Ask your health care provider what activities are safe for you. Do exercises as told by your health care provider. General instructions Take over-the-counter and prescription medicines only as told by your health care provider. Ask your health care provider if the medicine prescribed to you: Requires you to avoid driving or using machinery. Can cause constipation. You may need to take these actions to prevent or treat constipation: Drink enough fluid to keep your urine pale yellow. Take over-the-counter  or prescription medicines. Eat foods that are high in fiber, such as beans, whole grains, and fresh fruits and vegetables. Limit foods that are high in fat and processed sugars, such as fried or sweet foods. Do not use any products that contain nicotine or tobacco. These products include cigarettes, chewing tobacco, and vaping devices, such as e-cigarettes. If you need help quitting, ask your health care provider. How is  this prevented? To prevent a future low-back injury: Always warm up properly before physical activity or sports. Cool down and stretch after being active. Use correct form when playing sports and lifting heavy objects. Bend your knees before you lift heavy objects. Use good posture when sitting and standing. Stay physically fit and keep a healthy weight. Do at least 150 minutes of moderate intensity exercise each week, such as brisk walking or water aerobics. Do strength exercises at least 2 times each week. Contact a health care provider if: Your back pain does not improve after several weeks of treatment. Your symptoms get worse. You have a fever. Get help right away if: Your back pain is severe. You are unable to stand or walk. You develop pain in your legs. You have weakness in your buttocks or legs. You have trouble controlling when you urinate or when you have a bowel movement. You have frequent, painful, or bloody urination. This information is not intended to replace advice given to you by your health care provider. Make sure you discuss any questions you have with your health care provider. Document Revised: 01/17/2022 Document Reviewed: 01/17/2022 Elsevier Patient Education  [place] [facility] Strain [facility] Ask your health care provider which exercises are safe for you. Do exercises exactly as told by your health care provider and adjust them as directed. It is normal to feel mild stretching, pulling, tightness, or discomfort as you do these exercises. Stop right away if you feel sudden pain or your pain gets worse. Do not begin these exercises until told by your health care provider. Stretching and range-of-motion exercises These exercises warm up your muscles and joints and improve the movement and flexibility of your back. These exercises also help to relieve pain, numbness, and tingling. Lumbar rotation  Lie on your back on a firm bed or the floor with  your knees bent. Straighten your arms out to your sides so each arm forms a 90-degree angle (right angle) with a side of your body. Slowly move (rotate) both of your knees to one side of your body until you feel a stretch in your lower back (lumbar). Try not to let your shoulders lift off the floor. Hold this position for __________ seconds. Tense your abdominal muscles and slowly move your knees back to the starting position. Repeat this exercise on the other side of your body. Repeat __________ times. Complete this exercise __________ times a day. Single knee to chest  Lie on your back on a firm bed or the floor with both legs straight. Bend one of your knees. Use your hands to move your knee up toward your chest until you feel a gentle stretch in your lower back and buttock. Hold your leg in this position by holding on to the front of your knee. Keep your other leg as straight as possible. Hold this position for __________ seconds. Slowly return to the starting position. Repeat with your other leg. Repeat __________ times. Complete this exercise __________ times a day. Prone extension on elbows  Lie on your abdomen  on a firm bed or the floor (prone position). Prop yourself up on your elbows. Use your arms to help lift your chest up until you feel a gentle stretch in your abdomen and your lower back. This will place some of your body weight on your elbows. If this is uncomfortable, try stacking pillows under your chest. Your hips should stay down, against the surface that you are lying on. Keep your hip and back muscles relaxed. Hold this position for __________ seconds. Slowly relax your upper body and return to the starting position. Repeat __________ times. Complete this exercise __________ times a day. Strengthening exercises These exercises build strength and endurance in your back. Endurance is the ability to use your muscles for a long time, even after they get tired. Pelvic  tilt This exercise strengthens the muscles that lie deep in the abdomen. Lie on your back on a firm bed or the floor with your legs extended. Bend your knees so they are pointing toward the ceiling and your feet are flat on the floor. Tighten your lower abdominal muscles to press your lower back against the floor. This motion will tilt your pelvis so your tailbone points up toward the ceiling instead of pointing to your feet or the floor. To help with this exercise, you may place a small towel under your lower back and try to push your back into the towel. Hold this position for __________ seconds. Let your muscles relax completely before you repeat this exercise. Repeat __________ times. Complete this exercise __________ times a day. Alternating arm and leg raises  Get on your hands and knees on a firm surface. If you are on a hard floor, you may want to use padding, such as an exercise mat, to cushion your knees. Line up your arms and legs. Your hands should be directly below your shoulders, and your knees should be directly below your hips. Lift your left leg behind you. At the same time, raise your right arm and straighten it in front of you. Do not lift your leg higher than your hip. Do not lift your arm higher than your shoulder. Keep your abdominal and back muscles tight. Keep your hips facing the ground. Do not arch your back. Keep your balance carefully, and do not hold your breath. Hold this position for __________ seconds. Slowly return to the starting position. Repeat with your right leg and your left arm. Repeat __________ times. Complete this exercise __________ times a day. Abdominal set with straight leg raise  Lie on your back on a firm bed or the floor. Bend one of your knees and keep your other leg straight. Tense your abdominal muscles and lift your straight leg up, 4-6 inches (10-15 cm) off the ground. Keep your abdominal muscles tight and hold this position for  __________ seconds. Do not hold your breath. Do not arch your back. Keep it flat against the ground. Keep your abdominal muscles tense as you slowly lower your leg back to the starting position. Repeat with your other leg. Repeat __________ times. Complete this exercise __________ times a day. Single leg lower with bent knees Lie on your back on a firm bed or the floor. Tense your abdominal muscles and lift your feet off the floor, one foot at a time, so your knees and hips are bent in 90-degree angles (right angles). Your knees should be over your hips and your lower legs should be parallel to the floor. Keeping your abdominal muscles tense and your  knee bent, slowly lower one of your legs so your toe touches the ground. Lift your leg back up to return to the starting position. Do not hold your breath. Do not let your back arch. Keep your back flat against the ground. Repeat with your other leg. Repeat __________ times. Complete this exercise __________ times a day. Posture and body mechanics Good posture and healthy body mechanics can help to relieve stress in your body's tissues and joints. Body mechanics refers to the movements and positions of your body while you do your daily activities. Posture is part of body mechanics. Good posture means: Your spine is in its natural S-curve position (neutral). Your shoulders are pulled back slightly. Your head is not tipped forward (neutral). Follow these guidelines to improve your posture and body mechanics in your everyday activities. Standing  When standing, keep your spine neutral and your feet about hip-width apart. Keep a slight bend in your knees. Your ears, shoulders, and hips should line up. When you do a task in which you stand in one place for a long time, place one foot up on a stable object that is 2-4 inches (5-10 cm) high, such as a footstool. This helps keep your spine neutral. Sitting  When sitting, keep your spine neutral and  keep your feet flat on the floor. Use a footrest, if necessary, and keep your thighs parallel to the floor. Avoid rounding your shoulders, and avoid tilting your head forward. When working at a desk or a computer, keep your desk at a height where your hands are slightly lower than your elbows. Slide your chair under your desk so you are close enough to maintain good posture. When working at a computer, place your monitor at a height where you are looking straight ahead and you do not have to tilt your head forward or downward to look at the screen. Resting When lying down and resting, avoid positions that are most painful for you. If you have pain with activities such as sitting, bending, stooping, or squatting, lie in a position in which your body does not bend very much. For example, avoid curling up on your side with your arms and knees near your chest (fetal position). If you have pain with activities such as standing for a long time or reaching with your arms, lie with your spine in a neutral position and bend your knees slightly. Try the following positions: Lying on your side with a pillow between your knees. Lying on your back with a pillow under your knees. Lifting  When lifting objects, keep your feet at least shoulder-width apart and tighten your abdominal muscles. Bend your knees and hips and keep your spine neutral. It is important to lift using the strength of your legs, not your back. Do not lock your knees straight out. Always ask for help to lift heavy or awkward objects. This information is not intended to replace advice given to you by your health care provider. Make sure you discuss any questions you have with your health care provider. Document Revised: 09/13/2020 Document Reviewed: 09/13/2020 Elsevier Patient Education  [place].

## 2022-09-07 ENCOUNTER — Telehealth: Payer: Self-pay | Admitting: Internal Medicine

## 2022-09-07 NOTE — Telephone Encounter (Signed)
Contacted Henesis Mullins to schedule their annual wellness visit. Appointment made for 11/09/2022.  [place] Direct Dial: [phone]

## 2022-09-27 ENCOUNTER — Telehealth: Payer: Self-pay | Admitting: Internal Medicine

## 2022-09-27 NOTE — Telephone Encounter (Signed)
Contacted Sonal Dewitz to schedule their annual wellness visit. Appointment made for 11/16/2022.  [place] Direct Dial: [phone]

## 2022-10-30 DIAGNOSIS — K080 Exfoliation of teeth due to systemic causes: Secondary | ICD-10-CM | POA: Diagnosis not present

## 2022-11-09 ENCOUNTER — Ambulatory Visit: Payer: Medicare Other

## 2022-11-16 ENCOUNTER — Ambulatory Visit (INDEPENDENT_AMBULATORY_CARE_PROVIDER_SITE_OTHER): Payer: Medicare Other

## 2022-11-16 VITALS — BP 106/68 | Ht 61.0 in | Wt 132.6 lb

## 2022-11-16 DIAGNOSIS — Z0000 Encounter for general adult medical examination without abnormal findings: Secondary | ICD-10-CM

## 2022-11-16 DIAGNOSIS — H9193 Unspecified hearing loss, bilateral: Secondary | ICD-10-CM

## 2022-11-16 NOTE — Progress Notes (Signed)
Subjective:   Jade Reese is a 67 y.o. female who presents for Medicare Annual (Subsequent) preventive examination.  Review of Systems    Cardiac Risk Factors include: advanced age (>54men, >100 women);dyslipidemia    Objective:    Today's Vitals   11/16/22 1436  BP: 106/68  Weight: 132 lb 9.6 oz (60.1 kg)  Height: 5\' 1"  (1.549 m)  PainSc: 3    Body mass index is 25.05 kg/m.     11/16/2022    2:53 PM 11/03/2021    3:30 PM  Advanced Directives  Does Patient Have a Medical Advance Directive? No No  Would patient like information on creating a medical advance directive?  Yes (MAU/Ambulatory/Procedural Areas - Information given)    Current Medications (verified) Outpatient Encounter Medications as of 11/16/2022  Medication Sig   alendronate (FOSAMAX) 70 MG tablet Take 1 tablet (70 mg total) by mouth once a week.   Calcium Carb-Cholecalciferol (CALCIUM 600+D3 PO) Take by mouth.   celecoxib (CELEBREX) 100 MG capsule Take 1 capsule (100 mg total) by mouth daily.   cetirizine (ZYRTEC) 10 MG tablet Take 10 mg by mouth daily.   Multiple Vitamins-Minerals (MULTIVITAMIN WOMEN) TABS Take by mouth.   Polyethylene Glycol 3350 (MIRALAX PO) Take by mouth.   No facility-administered encounter medications on file as of 11/16/2022.    Allergies (verified) Penicillins, Bee venom, Oxaprozin, Sodium hyaluronate (avian), and Voltaren [diclofenac sodium]   History: Past Medical History:  Diagnosis Date   Allergy 2003/07/11   pollen seasons   Arthritis    Depression 2015/07/11   on n off, minor , controled   Osteoporosis    Vaginal atrophy    Past Surgical History:  Procedure Laterality Date   ABDOMINAL HYSTERECTOMY     BREAST BIOPSY     CYST EXCISION     No family history on file. Social History   Socioeconomic History   Marital status: Married    Spouse name: Not on file   Number of children: Not on file   Years of education: Not on file   Highest education level: Not on  file  Occupational History   Not on file  Tobacco Use   Smoking status: Never   Smokeless tobacco: Never  Vaping Use   Vaping Use: Never used  Substance and Sexual Activity   Alcohol use: Never   Drug use: Never   Sexual activity: Yes  Other Topics Concern   Not on file  Social History Narrative   Not on file   Social Determinants of Health   Financial Resource Strain: Low Risk  (11/16/2022)   Overall Financial Resource Strain (CARDIA)    Difficulty of Paying Living Expenses: Not hard at all  Food Insecurity: No Food Insecurity (11/16/2022)   Hunger Vital Sign    Worried About Running Out of Food in the Last Year: Never true    Ran Out of Food in the Last Year: Never true  Transportation Needs: No Transportation Needs (11/16/2022)   PRAPARE - Administrator, Civil Service (Medical): No    Lack of Transportation (Non-Medical): No  Physical Activity: Sufficiently Active (11/16/2022)   Exercise Vital Sign    Days of Exercise per Week: 6 days    Minutes of Exercise per Session: 60 min  Stress: No Stress Concern Present (11/16/2022)   [employer] of Occupational Health - Occupational Stress Questionnaire    Feeling of Stress : Only a little  Social Connections: Moderately Isolated (11/16/2022)  Social Advertising account executive [NHANES]    Frequency of Communication with Friends and Family: More than three times a week    Frequency of Social Gatherings with Friends and Family: Twice a week    Attends Religious Services: Never    Database administrator or Organizations: No    Attends Engineer, structural: Never    Marital Status: Married    Tobacco Counseling Counseling given: Not Answered  Clinical Intake:    Pain : 0-10 Pain Score: 3  Pain Type: Acute pain Pain Location: Hip (left knee and foot pain and numbness) Pain Orientation: Left Pain Descriptors / Indicators: Aching, Numbness Pain Onset: 1 to 4 weeks ago Pain Frequency:  Intermittent Pain Relieving Factors: medication, heat  Pain Relieving Factors: medication, heat  BMI - recorded: 25.05 Nutritional Status: BMI 25 -29 Overweight Nutritional Risks: None Diabetes: No  How often do you need to have someone help you when you read instructions, pamphlets, or other written materials from your doctor or pharmacy?: 1 - Never  Diabetic?no  Interpreter Needed?: No  Comments: lives with husband Information entered by :: B.Annaleigh Steinmeyer,LPN   Activities of Daily Living    11/16/2022    2:53 PM 09/04/2022   12:55 PM  In your present state of health, do you have any difficulty performing the following activities:  Hearing? 1 0  Vision? 0 0  Difficulty concentrating or making decisions? 0 1  Walking or climbing stairs? 0 0  Dressing or bathing? 0 0  Doing errands, shopping? 0 0  Preparing Food and eating ? N   Using the Toilet? N   In the past six months, have you accidently leaked urine? N   Do you have problems with loss of bowel control? N   Managing your Medications? N   Managing your Finances? N   Housekeeping or managing your Housekeeping? N     Patient Care Team: Margarita Mail, DO as PCP - General (Internal Medicine)  Indicate any recent Medical Services you may have received from other than [facility] providers in the past year (date may be approximate).     Assessment:   This is a routine wellness examination for Jade Reese.  Hearing/Vision screen Hearing Screening - Comments:: Adequate hearing (slightly less) Vision Screening - Comments:: Adequate vision [facility]  Dietary issues and exercise activities discussed: Current Exercise Habits: Home exercise routine, Type of exercise: walking, Time (Minutes): 60, Frequency (Times/Week): 6, Weekly Exercise (Minutes/Week): 360, Intensity: Mild, Exercise limited by: orthopedic condition(s);neurologic condition(s)   Goals Addressed   None    Depression Screen    11/16/2022    2:47 PM 09/04/2022    12:55 PM 07/13/2022    4:00 PM 07/13/2022    3:58 PM 04/24/2022    3:29 PM 03/20/2022    1:05 PM 02/27/2022    1:19 PM  PHQ 2/9 Scores  PHQ - 2 Score 0 0 0 0 0 2 0  PHQ- 9 Score  0 0 0 0 2 0    Fall Risk    11/16/2022    2:44 PM 09/04/2022   12:55 PM 07/13/2022    4:00 PM 07/13/2022    3:58 PM 04/24/2022    3:29 PM  Fall Risk   Falls in the past year? 0 0 0 0 0  Number falls in past yr: 0 0 0 0 0  Injury with Fall? 0 0 0 0 0  Risk for fall due to : No Fall Risks  Follow up Education provided;Falls prevention discussed        FALL RISK PREVENTION PERTAINING TO THE HOME:  Any stairs in or around the home? Yes  If so, are there any without handrails? Yes  Home free of loose throw rugs in walkways, pet beds, electrical cords, etc? Yes  Adequate lighting in your home to reduce risk of falls? Yes   ASSISTIVE DEVICES UTILIZED TO PREVENT FALLS:  Life alert? No  Use of a cane, walker or w/c? No  Grab bars in the bathroom? No  Shower chair or bench in shower? No  Elevated toilet seat or a handicapped toilet? Yes   TIMED UP AND GO:  Was the test performed? Yes .  Length of time to ambulate 10 feet: 8 sec.   Gait steady and fast without use of assistive device  Cognitive Function:        11/16/2022    2:56 PM  6CIT Screen  What Year? 0 points  What month? 0 points  What time? 0 points  Count back from 20 0 points  Months in reverse 0 points  Repeat phrase 4 points  Total Score 4 points    Immunizations Immunization History  Administered Date(s) Administered   Influenza Nasal 06/09/2021   PFIZER(Purple Top)SARS-COV-2 Vaccination 10/16/2019, 11/06/2019, 06/30/2020, 03/01/2021   PNEUMOCOCCAL CONJUGATE-20 03/01/2021   Tdap 03/09/2021    TDAP status: Up to date  Flu Vaccine status: Declined, Education has been provided regarding the importance of this vaccine but patient still declined. Advised may receive this vaccine at local pharmacy or Health Dept. Aware to  provide a copy of the vaccination record if obtained from local pharmacy or Health Dept. Verbalized acceptance and understanding.  Pneumococcal vaccine status: Up to date  Covid-19 vaccine status: Completed vaccines  Qualifies for Shingles Vaccine? Yes   Zostavax completed Yes   Shingrix Completed?: Yes  Screening Tests Health Maintenance  Topic Date Due   Zoster Vaccines- Shingrix (1 of 2) Never done   COVID-19 Vaccine (5 - 2023-24 season) 03/10/2022   INFLUENZA VACCINE  02/08/2023   MAMMOGRAM  03/01/2023   Medicare Annual Wellness (AWV)  11/16/2023   Fecal DNA (Cologuard)  09/24/2024   DTaP/Tdap/Td (2 - Td or Tdap) 03/10/2031   Pneumonia Vaccine 41+ Years old  Completed   DEXA SCAN  Completed   Hepatitis C Screening  Completed   HPV VACCINES  Aged Out    Health Maintenance  Health Maintenance Due  Topic Date Due   Zoster Vaccines- Shingrix (1 of 2) Never done   COVID-19 Vaccine (5 - 2023-24 season) 03/10/2022    Colorectal cancer screening: Type of screening: Cologuard. Completed yes. Repeat every 2026 years  Mammogram status: Completed yes. Repeat every year  Bone Density status: Completed yes. Results reflect: Bone density results: OSTEOPOROSIS. Repeat every 3 years.  Lung Cancer Screening: (Low Dose CT Chest recommended if Age 63-80 years, 30 pack-year currently smoking OR have quit w/in 15years.) does not qualify.   Lung Cancer Screening Referral: no  Additional Screening:  Hepatitis C Screening: does not qualify; Completed no  Vision Screening: Recommended annual ophthalmology exams for early detection of glaucoma and other disorders of the eye. Is the patient up to date with their annual eye exam?  Yes  Who is the provider or what is the name of the office in which the patient attends annual eye exams? [facility] If pt is not established with a provider, would they like to be referred to  a provider to establish care? No .   Dental Screening: Recommended  annual dental exams for proper oral hygiene  Community Resource Referral / Chronic Care Management: CRR required this visit?  No   CCM required this visit?  No     Plan:     I have personally reviewed and noted the following in the patient's chart:   Medical and social history Use of alcohol, tobacco or illicit drugs  Current medications and supplements including opioid prescriptions. Patient is not currently taking opioid prescriptions. Functional ability and status Nutritional status Physical activity Advanced directives List of other physicians Hospitalizations, surgeries, and ER visits in previous 12 months Vitals Screenings to include cognitive, depression, and falls Referrals and appointments  In addition, I have reviewed and discussed with patient certain preventive protocols, quality metrics, and best practice recommendations. A written personalized care plan for preventive services as well as general preventive health recommendations were provided to patient.   Sue Lush, LPN   01/15/2955   Nurse Notes: Pt has complaints of intermittent left hip and knee pain and numbness. She relays the celebrex does not help many times. Pt also relays decreased hearing and desires hearing test.

## 2022-11-16 NOTE — Patient Instructions (Addendum)
Jade Reese , Thank you for taking time to come for your Medicare Wellness Visit. I appreciate your ongoing commitment to your health goals. Please review the following plan we discussed and let me know if I can assist you in the future.   These are the goals we discussed:  Goals   None     This is a list of the screening recommended for you and due dates:  Health Maintenance  Topic Date Due   Zoster (Shingles) Vaccine (1 of 2) Never done   COVID-19 Vaccine (5 - 2023-24 season) 03/10/2022   Flu Shot  02/08/2023   Mammogram  03/01/2023   Medicare Annual Wellness Visit  11/16/2023   Cologuard (Stool DNA test)  09/24/2024   DTaP/Tdap/Td vaccine (2 - Td or Tdap) 03/10/2031   Pneumonia Vaccine  Completed   DEXA scan (bone density measurement)  Completed   Hepatitis C Screening: USPSTF Recommendation to screen - Ages 87-79 yo.  Completed   HPV Vaccine  Aged Out    Advanced directives: no  Conditions/risks identified: low falls risk  Next appointment: Follow up in one year for your annual wellness visit 11/23/2023 @2 :30pm in person   Preventive Care 65 Years and Older, Female Preventive care refers to lifestyle choices and visits with your health care provider that can promote health and wellness. What does preventive care include? A yearly physical exam. This is also called an annual well check. Dental exams once or twice a year. Routine eye exams. Ask your health care provider how often you should have your eyes checked. Personal lifestyle choices, including: Daily care of your teeth and gums. Regular physical activity. Eating a healthy diet. Avoiding tobacco and drug use. Limiting alcohol use. Practicing safe sex. Taking low-dose aspirin every day. Taking vitamin and mineral supplements as recommended by your health care provider. What happens during an annual well check? The services and screenings done by your health care provider during your annual well check will depend on  your age, overall health, lifestyle risk factors, and family history of disease. Counseling  Your health care provider may ask you questions about your: Alcohol use. Tobacco use. Drug use. Emotional well-being. Home and relationship well-being. Sexual activity. Eating habits. History of falls. Memory and ability to understand (cognition). Work and work Astronomer. Reproductive health. Screening  You may have the following tests or measurements: Height, weight, and BMI. Blood pressure. Lipid and cholesterol levels. These may be checked every 5 years, or more frequently if you are over 30 years old. Skin check. Lung cancer screening. You may have this screening every year starting at age 34 if you have a 30-pack-year history of smoking and currently smoke or have quit within the past 15 years. Fecal occult blood test (FOBT) of the stool. You may have this test every year starting at age 55. Flexible sigmoidoscopy or colonoscopy. You may have a sigmoidoscopy every 5 years or a colonoscopy every 10 years starting at age 26. Hepatitis C blood test. Hepatitis B blood test. Sexually transmitted disease (STD) testing. Diabetes screening. This is done by checking your blood sugar (glucose) after you have not eaten for a while (fasting). You may have this done every 1-3 years. Bone density scan. This is done to screen for osteoporosis. You may have this done starting at age 82. Mammogram. This may be done every 1-2 years. Talk to your health care provider about how often you should have regular mammograms. Talk with your health care provider about your  test results, treatment options, and if necessary, the need for more tests. Vaccines  Your health care provider may recommend certain vaccines, such as: Influenza vaccine. This is recommended every year. Tetanus, diphtheria, and acellular pertussis (Tdap, Td) vaccine. You may need a Td booster every 10 years. Zoster vaccine. You may need this  after age 46. Pneumococcal 13-valent conjugate (PCV13) vaccine. One dose is recommended after age 66. Pneumococcal polysaccharide (PPSV23) vaccine. One dose is recommended after age 15. Talk to your health care provider about which screenings and vaccines you need and how often you need them. This information is not intended to replace advice given to you by your health care provider. Make sure you discuss any questions you have with your health care provider. Document Released: 07/23/2015 Document Revised: 03/15/2016 Document Reviewed: 04/27/2015 Elsevier Interactive Patient Education  2017 [employer].  Fall Prevention in the Home Falls can cause injuries. They can happen to people of all ages. There are many things you can do to make your home safe and to help prevent falls. What can I do on the outside of my home? Regularly fix the edges of walkways and driveways and fix any cracks. Remove anything that might make you trip as you walk through a door, such as a raised step or threshold. Trim any bushes or trees on the path to your home. Use bright outdoor lighting. Clear any walking paths of anything that might make someone trip, such as rocks or tools. Regularly check to see if handrails are loose or broken. Make sure that both sides of any steps have handrails. Any raised decks and porches should have guardrails on the edges. Have any leaves, snow, or ice cleared regularly. Use sand or salt on walking paths during winter. Clean up any spills in your garage right away. This includes oil or grease spills. What can I do in the bathroom? Use night lights. Install grab bars by the toilet and in the tub and shower. Do not use towel bars as grab bars. Use non-skid mats or decals in the tub or shower. If you need to sit down in the shower, use a plastic, non-slip stool. Keep the floor dry. Clean up any water that spills on the floor as soon as it happens. Remove soap buildup in the tub or  shower regularly. Attach bath mats securely with double-sided non-slip rug tape. Do not have throw rugs and other things on the floor that can make you trip. What can I do in the bedroom? Use night lights. Make sure that you have a light by your bed that is easy to reach. Do not use any sheets or blankets that are too big for your bed. They should not hang down onto the floor. Have a firm chair that has side arms. You can use this for support while you get dressed. Do not have throw rugs and other things on the floor that can make you trip. What can I do in the kitchen? Clean up any spills right away. Avoid walking on wet floors. Keep items that you use a lot in easy-to-reach places. If you need to reach something above you, use a strong step stool that has a grab bar. Keep electrical cords out of the way. Do not use floor polish or wax that makes floors slippery. If you must use wax, use non-skid floor wax. Do not have throw rugs and other things on the floor that can make you trip. What can I do with my  stairs? Do not leave any items on the stairs. Make sure that there are handrails on both sides of the stairs and use them. Fix handrails that are broken or loose. Make sure that handrails are as long as the stairways. Check any carpeting to make sure that it is firmly attached to the stairs. Fix any carpet that is loose or worn. Avoid having throw rugs at the top or bottom of the stairs. If you do have throw rugs, attach them to the floor with carpet tape. Make sure that you have a light switch at the top of the stairs and the bottom of the stairs. If you do not have them, ask someone to add them for you. What else can I do to help prevent falls? Wear shoes that: Do not have high heels. Have rubber bottoms. Are comfortable and fit you well. Are closed at the toe. Do not wear sandals. If you use a stepladder: Make sure that it is fully opened. Do not climb a closed stepladder. Make  sure that both sides of the stepladder are locked into place. Ask someone to hold it for you, if possible. Clearly mark and make sure that you can see: Any grab bars or handrails. First and last steps. Where the edge of each step is. Use tools that help you move around (mobility aids) if they are needed. These include: Canes. Walkers. Scooters. Crutches. Turn on the lights when you go into a dark area. Replace any light bulbs as soon as they burn out. Set up your furniture so you have a clear path. Avoid moving your furniture around. If any of your floors are uneven, fix them. If there are any pets around you, be aware of where they are. Review your medicines with your doctor. Some medicines can make you feel dizzy. This can increase your chance of falling. Ask your doctor what other things that you can do to help prevent falls. This information is not intended to replace advice given to you by your health care provider. Make sure you discuss any questions you have with your health care provider. Document Released: 04/22/2009 Document Revised: 12/02/2015 Document Reviewed: 07/31/2014 Elsevier Interactive Patient Education  2017 [employer].

## 2022-11-20 ENCOUNTER — Ambulatory Visit: Payer: Medicare Other | Attending: Audiologist | Admitting: Audiologist

## 2022-11-20 DIAGNOSIS — H9193 Unspecified hearing loss, bilateral: Secondary | ICD-10-CM | POA: Insufficient documentation

## 2022-11-20 NOTE — Procedures (Signed)
  Outpatient Audiology and [facility] [address] [place], [place]  [address] [phone]  AUDIOLOGICAL  EVALUATION  NAME: Jade Reese     DOB:   06-10-1956      MRN: [id number]                                                                                     DATE: 11/20/2022     REFERENT: Margarita Mail, DO STATUS: Outpatient DIAGNOSIS: Decreased Hearing     History: Jade Reese was seen for an audiological evaluation. Jade Reese was accompanied to the appointment by her husband who interpreted as needed. Jade Reese is receiving a hearing evaluation due to concerns for a change in hearing. Jade Reese has difficulty finding where sounds are coming from, and has a hearing loss that seems to come and go. This difficulty began gradually. No pain or pressure reported in either ear. Tinnitus denied for both ears. Jade Reese will randomly turn up the TV very loud, her husband thinks some days Jade Reese cannot hear and others she hears well. Jade Reese cannot find her phone when it is ringing. She cannot tell if its behind or in front of her. Medical history shows no sign of a condition which is a risk factor for hearing loss. Kawanis Bonetti uses Qtips daily. She says her ears often itch. No other relevant case history reported.   Evaluation:  Otoscopy showed a clear view of the tympanic membranes, bilaterally. Skin look irritated, likely from over use of Qtips. Tympanometry results were consistent with normal middle ear function Audiometric testing was completed using conventional audiometry with supraural transducer. Pure tone thresholds show normal hearing loss in each ear. Left ear 10dB worse 4-8kHz, but still in normal range. Speech not tested as patient is not fluent in [place].   Results:  The test results were reviewed with Jade Reese and her husband. Jade Reese has normal hearing in each ear. The fluctuating loss is not currently present. This could be due to wax, fluid, or congestion. There is  no indication of hearing loss today. Jade Reese does have slightly worse hearing in the left ear, not technically a significant difference. This could explain her difficulty finding the phone by localizing the sound of it ringing.  Recommendations: 1. Annual hearing testing recommended to monitor left ear for progressive asymmetric change.   23 minutes spent testing and counseling on results.   Jade Reese  Audiologist, Au.D., CCC-A 11/20/2022  3:28 PM  Cc: Margarita Mail, DO

## 2022-12-06 NOTE — Progress Notes (Unsigned)
Established Patient Office Visit  Subjective:  Patient ID: Jade Reese, female    DOB: 04-Oct-1955  Age: 67 y.o. MRN: [id number]  CC:  No chief complaint on file.   HPI Jade Reese presents for follow up on chronic medical conditions.  An interpreter is here to help aid in communication.   Patient is having new back pain that started 1 month ago.  Pain is located in her bilateral lumbar spine.  Patient states that she was at [employer] and was trying to lift and push a heavy bag of fertilizer when she strained her back.  She states the pain is achy and dull in nature and worse with prolonged standing.  Pain is slowly improving but still present.  She is taking Celebrex as needed, which does improve her pain.  She does have an allergy to Voltaren which caused a rash, however she has not had any allergic reactions to the Celebrex.   Osteoporosis: -Per DEXA 01/17/21 lumbar spine -3.3, left hip -1.9 -Taking Fosamax weekly since August 2022 -Taking Vitamin D as well, uncertain of dose -Vitamin D levels in 7/22 low/normal at 30.3   HLD: -Medications: None -Last lipid panel: Lipid Panel     Component Value Date/Time   CHOL 204 (H) 02/27/2022 1408   TRIG 204 (H) 02/27/2022 1408   HDL 62 02/27/2022 1408   CHOLHDL 3.3 02/27/2022 1408   LDLCALC 110 (H) 02/27/2022 1408    The 10-year ASCVD risk score (Arnett DK, et al., 2019) is: 4.7%   Values used to calculate the score:     Age: 55 years     Sex: Female     Is Non-Hispanic African American: No     Diabetic: No     Tobacco smoker: No     Systolic Blood Pressure: 106 mmHg     Is BP treated: No     HDL Cholesterol: 62 mg/dL     Total Cholesterol: 204 mg/dL  Health Maintenance: -Blood work Due at follow up -Mammogram/DEXA: Mammogram last summer, plan to schedule again this summer. DEXA results above.  -Colon cancer screening: Cologuard 3/23 negative    Past Medical History:  Diagnosis Date   Allergy 2003/07/11   pollen  seasons   Arthritis    Depression 2015/07/11   on n off, minor , controled   Osteoporosis    Vaginal atrophy     No family history on file.  Social History   Socioeconomic History   Marital status: Married    Spouse name: Not on file   Number of children: Not on file   Years of education: Not on file   Highest education level: Not on file  Occupational History   Not on file  Tobacco Use   Smoking status: Never   Smokeless tobacco: Never  Vaping Use   Vaping Use: Never used  Substance and Sexual Activity   Alcohol use: Never   Drug use: Never   Sexual activity: Yes  Other Topics Concern   Not on file  Social History Narrative   Not on file   Social Determinants of Health   Financial Resource Strain: Low Risk  (11/16/2022)   Overall Financial Resource Strain (CARDIA)    Difficulty of Paying Living Expenses: Not hard at all  Food Insecurity: No Food Insecurity (11/16/2022)   Hunger Vital Sign    Worried About Running Out of Food in the Last Year: Never true    Ran Out of Food  in the Last Year: Never true  Transportation Needs: No Transportation Needs (11/16/2022)   PRAPARE - Administrator, Civil Service (Medical): No    Lack of Transportation (Non-Medical): No  Physical Activity: Sufficiently Active (11/16/2022)   Exercise Vital Sign    Days of Exercise per Week: 6 days    Minutes of Exercise per Session: 60 min  Stress: No Stress Concern Present (11/16/2022)   [employer] of Occupational Health - Occupational Stress Questionnaire    Feeling of Stress : Only a little  Social Connections: Moderately Isolated (11/16/2022)   Social Connection and Isolation Panel [NHANES]    Frequency of Communication with Friends and Family: More than three times a week    Frequency of Social Gatherings with Friends and Family: Twice a week    Attends Religious Services: Never    Database administrator or Organizations: No    Attends Banker Meetings: Never     Marital Status: Married  Catering manager Violence: Not At Risk (11/16/2022)   Humiliation, Afraid, Rape, and Kick questionnaire    Fear of Current or Ex-Partner: No    Emotionally Abused: No    Physically Abused: No    Sexually Abused: No    ROS Review of Systems  Constitutional:  Negative for chills and fever.  Eyes:  Negative for visual disturbance.  Respiratory:  Negative for cough and shortness of breath.   Cardiovascular:  Negative for chest pain.  Gastrointestinal:  Negative for abdominal pain and nausea.  Musculoskeletal:  Positive for back pain.  Neurological:  Negative for dizziness and headaches.    Objective:   Today's Vitals: There were no vitals taken for this visit.  Physical Exam Constitutional:      Appearance: Normal appearance.  HENT:     Head: Normocephalic and atraumatic.  Eyes:     Conjunctiva/sclera: Conjunctivae normal.  Cardiovascular:     Rate and Rhythm: Normal rate and regular rhythm.  Pulmonary:     Effort: Pulmonary effort is normal.     Breath sounds: Normal breath sounds.  Musculoskeletal:     Right lower leg: No edema.     Left lower leg: No edema.  Skin:    General: Skin is warm and dry.  Neurological:     General: No focal deficit present.     Mental Status: She is alert. Mental status is at baseline.  Psychiatric:        Mood and Affect: Mood normal.        Behavior: Behavior normal.     Assessment & Plan:   1. Strain of lumbar region, initial encounter/Arthritis: Patient doing well with Celebrex, will prescribe 100 mg to take once a day until her follow-up.  Patient also notes that the Celebrex has been helping with her arthritis and swelling in her hands.  Discussed not taking any other anti-inflammatories while on this medication and taking with food.  Also discussed conservative treatments for lumbar strain including moist heat and gentle stretching.  Lumbar stretches printed for the patient.  Follow-up in 3 months to recheck  kidney function.  - celecoxib (CELEBREX) 100 MG capsule; Take 1 capsule (100 mg total) by mouth daily.  Dispense: 90 capsule; Refill: 0  2. Localized osteoporosis without current pathological fracture: Stable, continue Fosamax 70 mg weekly, refilled today.  Plan for repeat DEXA scan and mammogram in July of this year.  Will also obtain vitamin D levels at follow-up.  - alendronate (FOSAMAX) 70  MG tablet; Take 1 tablet (70 mg total) by mouth once a week.  Dispense: 12 tablet; Refill: 1   Follow-up: No follow-ups on file.   Margarita Mail, DO

## 2022-12-07 ENCOUNTER — Ambulatory Visit (INDEPENDENT_AMBULATORY_CARE_PROVIDER_SITE_OTHER): Payer: Medicare Other | Admitting: Internal Medicine

## 2022-12-07 ENCOUNTER — Encounter: Payer: Self-pay | Admitting: Internal Medicine

## 2022-12-07 VITALS — BP 124/74 | HR 69 | Temp 97.8°F | Resp 16 | Ht 61.0 in | Wt 133.7 lb

## 2022-12-07 DIAGNOSIS — M810 Age-related osteoporosis without current pathological fracture: Secondary | ICD-10-CM

## 2022-12-07 DIAGNOSIS — D170 Benign lipomatous neoplasm of skin and subcutaneous tissue of head, face and neck: Secondary | ICD-10-CM

## 2022-12-07 DIAGNOSIS — M1990 Unspecified osteoarthritis, unspecified site: Secondary | ICD-10-CM

## 2022-12-07 DIAGNOSIS — M79671 Pain in right foot: Secondary | ICD-10-CM | POA: Diagnosis not present

## 2022-12-07 DIAGNOSIS — Z1231 Encounter for screening mammogram for malignant neoplasm of breast: Secondary | ICD-10-CM

## 2022-12-07 MED ORDER — CELECOXIB 100 MG PO CAPS: 100.0000 mg | ORAL_CAPSULE | Freq: Every day | ORAL | 0 refills | Status: DC

## 2022-12-07 NOTE — Patient Instructions (Addendum)
It was great seeing you today!  Plan discussed at today's visit: -Medication refilled -Call number on card to schedule mammogram and bone scan in July -Call number to schedule ultrasound as well  -Recommend going to [employer] in [place] to get measured and customized for good shoes, if you are still having pain let me know   Follow up in: 6 months or sooner as needed  Take care and let us know if you have any questions or concerns prior to your next visit.  Dr. Caralee Ates

## 2022-12-11 ENCOUNTER — Other Ambulatory Visit: Payer: Self-pay | Admitting: *Deleted

## 2022-12-11 ENCOUNTER — Inpatient Hospital Stay
Admission: RE | Admit: 2022-12-11 | Discharge: 2022-12-11 | Disposition: A | Discharge status: Home / Self Care | Payer: Self-pay | Source: Ambulatory Visit | Attending: Internal Medicine | Admitting: Internal Medicine

## 2022-12-11 DIAGNOSIS — Z1231 Encounter for screening mammogram for malignant neoplasm of breast: Secondary | ICD-10-CM

## 2022-12-15 ENCOUNTER — Ambulatory Visit
Admission: RE | Admit: 2022-12-15 | Discharge: 2022-12-15 | Disposition: A | Discharge status: Home / Self Care | Payer: Medicare Other | Source: Ambulatory Visit | Attending: Internal Medicine | Admitting: Internal Medicine

## 2022-12-15 DIAGNOSIS — D170 Benign lipomatous neoplasm of skin and subcutaneous tissue of head, face and neck: Secondary | ICD-10-CM | POA: Diagnosis not present

## 2022-12-15 DIAGNOSIS — Z0389 Encounter for observation for other suspected diseases and conditions ruled out: Secondary | ICD-10-CM | POA: Diagnosis not present

## 2023-01-09 ENCOUNTER — Ambulatory Visit: Payer: Self-pay

## 2023-01-09 DIAGNOSIS — I951 Orthostatic hypotension: Secondary | ICD-10-CM | POA: Diagnosis not present

## 2023-01-09 DIAGNOSIS — R42 Dizziness and giddiness: Secondary | ICD-10-CM | POA: Diagnosis not present

## 2023-01-09 NOTE — Telephone Encounter (Signed)
  Chief Complaint: Possible heat exhaustion Symptoms: Dizziness, weakness, no appetite Frequency: 2 days - after working in garden all day Pertinent Negatives: Patient denies fever Disposition: [] ED /[x] Urgent Care (no appt availability in office) / [] Appointment(In office/virtual)/ []  [facility]/ [] Home Care/ [] Refused Recommended Disposition /[] [facility]/ []  Follow-up with PCP Additional Notes: Spoke with pt's husband.Pt was working in her garden 2 days ago. That evening she started feeling very poorly and husband suspect heat exhaustion. Pt continues to be very weak, some dizziness, HA, low energy and has no appetite. No appts in office. PT will go to UC for care.   Reason for Disposition  Normal mild dehydration suspected (e.g., dizziness, weakness, nausea) from heat exposure  Answer Assessment - Initial Assessment Questions 1. SYMPTOMS: "What symptoms are you concerned about?"     Dizziness, weakness, no appetite, low energy, Dizziness, HA 2. ONSET:  "When did the symptoms start?"     2 days ago 3. FEVER: "Do you have a fever?" If Yes, ask: "What is it, how was it measured, and when did it start?"      no 4. HEAT EXPOSURE: "What caused you to become hot?" (e.g., outside in the sun, inside a hot room)     2 days ago 5. PHYSICAL ACTIVITY:  "What type of physical activity were you doing?" (e.g., working, sports)     Gardening 6. SICK: "Have you been sick recently?" (e.g., cold, flu, recent fever)     no 7. OTHER SYMPTOMS: "Do you have any other symptoms?" (e.g., fainting, flushed skin, weakness, nausea, vomiting, muscle cramps)     Dizziness, weakness, no appetite, low energy, Dizziness, HA  Protocols used: Heat Exposure (Heat Exhaustion and Heat Stroke)-A-AH

## 2023-01-25 ENCOUNTER — Ambulatory Visit
Admission: RE | Admit: 2023-01-25 | Discharge: 2023-01-25 | Disposition: A | Discharge status: Home / Self Care | Payer: Medicare Other | Source: Ambulatory Visit | Attending: Internal Medicine | Admitting: Internal Medicine

## 2023-01-25 DIAGNOSIS — M810 Age-related osteoporosis without current pathological fracture: Secondary | ICD-10-CM | POA: Insufficient documentation

## 2023-01-25 DIAGNOSIS — Z1231 Encounter for screening mammogram for malignant neoplasm of breast: Secondary | ICD-10-CM | POA: Diagnosis not present

## 2023-01-25 DIAGNOSIS — Z780 Asymptomatic menopausal state: Secondary | ICD-10-CM | POA: Diagnosis not present

## 2023-01-30 DIAGNOSIS — H47393 Other disorders of optic disc, bilateral: Secondary | ICD-10-CM | POA: Diagnosis not present

## 2023-01-30 DIAGNOSIS — H524 Presbyopia: Secondary | ICD-10-CM | POA: Diagnosis not present

## 2023-01-30 DIAGNOSIS — H2513 Age-related nuclear cataract, bilateral: Secondary | ICD-10-CM | POA: Diagnosis not present

## 2023-01-30 NOTE — Progress Notes (Unsigned)
   There were no vitals taken for this visit.   Subjective:    Patient ID: Jade Reese, female    DOB: [birth year], 67 y.o.   MRN: [id number]  HPI: Jade Reese is a 67 y.o. female  No chief complaint on file.   Relevant past medical, surgical, family and social history reviewed and updated as indicated. Interim medical history since our last visit reviewed. Allergies and medications reviewed and updated.  Review of Systems  Per HPI unless specifically indicated above     Objective:    There were no vitals taken for this visit.  Wt Readings from Last 3 Encounters:  12/07/22 133 lb 11.2 oz (60.6 kg)  11/16/22 132 lb 9.6 oz (60.1 kg)  09/04/22 133 lb (60.3 kg)    Physical Exam  Results for orders placed or performed in visit on 07/13/22  CBC w/Diff/Platelet  Result Value Ref Range   WBC 4.4 3.8 - 10.8 Thousand/uL   RBC 4.32 3.80 - 5.10 Million/uL   Hemoglobin 13.4 11.7 - 15.5 g/dL   HCT 82.9 56.2 - 13.0 %   MCV 91.0 80.0 - 100.0 fL   MCH 31.0 27.0 - 33.0 pg   MCHC 34.1 32.0 - 36.0 g/dL   RDW 86.5 78.4 - 69.6 %   Platelets 201 140 - 400 Thousand/uL   MPV 10.6 7.5 - 12.5 fL   Neutro Abs 2,429 1,500 - 7,800 cells/uL   Lymphs Abs 1,492 850 - 3,900 cells/uL   Absolute Monocytes 418 200 - 950 cells/uL   Eosinophils Absolute 40 15 - 500 cells/uL   Basophils Absolute 22 0 - 200 cells/uL   Neutrophils Relative % 55.2 %   Total Lymphocyte 33.9 %   Monocytes Relative 9.5 %   Eosinophils Relative 0.9 %   Basophils Relative 0.5 %  COMPLETE METABOLIC PANEL WITH GFR  Result Value Ref Range   Glucose, Bld 101 (H) 65 - 99 mg/dL   BUN 22 7 - 25 mg/dL   Creat 2.95 2.84 - 1.32 mg/dL   eGFR 95 > OR = 60 GM/WNU/2.72Z3   BUN/Creatinine Ratio SEE NOTE: 6 - 22 (calc)   Sodium 141 135 - 146 mmol/L   Potassium 4.6 3.5 - 5.3 mmol/L   Chloride 105 98 - 110 mmol/L   CO2 28 20 - 32 mmol/L   Calcium 9.1 8.6 - 10.4 mg/dL   Total Protein 6.5 6.1 - 8.1 g/dL   Albumin 4.2 3.6 - 5.1  g/dL   Globulin 2.3 1.9 - 3.7 g/dL (calc)   AG Ratio 1.8 1.0 - 2.5 (calc)   Total Bilirubin 0.6 0.2 - 1.2 mg/dL   Alkaline phosphatase (APISO) 83 37 - 153 U/L   AST 16 10 - 35 U/L   ALT 11 6 - 29 U/L  Lipase  Result Value Ref Range   Lipase 44 7 - 60 U/L  TSH  Result Value Ref Range   TSH 3.18 0.40 - 4.50 mIU/L      Assessment & Plan:   Problem List Items Addressed This Visit   None    Follow up plan: No follow-ups on file.

## 2023-01-31 ENCOUNTER — Encounter: Payer: Self-pay | Admitting: Nurse Practitioner

## 2023-01-31 ENCOUNTER — Ambulatory Visit (INDEPENDENT_AMBULATORY_CARE_PROVIDER_SITE_OTHER): Payer: Medicare Other | Admitting: Nurse Practitioner

## 2023-01-31 ENCOUNTER — Other Ambulatory Visit: Payer: Self-pay

## 2023-01-31 VITALS — BP 110/68 | HR 76 | Temp 98.1°F | Resp 18 | Ht 61.0 in | Wt 134.1 lb

## 2023-01-31 DIAGNOSIS — D170 Benign lipomatous neoplasm of skin and subcutaneous tissue of head, face and neck: Secondary | ICD-10-CM

## 2023-02-07 ENCOUNTER — Ambulatory Visit: Payer: Medicare Other | Admitting: Surgery

## 2023-02-07 ENCOUNTER — Encounter: Payer: Self-pay | Admitting: Surgery

## 2023-02-07 VITALS — BP 131/85 | HR 69 | Temp 98.3°F | Ht 61.0 in | Wt 133.6 lb

## 2023-02-07 DIAGNOSIS — D170 Benign lipomatous neoplasm of skin and subcutaneous tissue of head, face and neck: Secondary | ICD-10-CM | POA: Diagnosis not present

## 2023-02-07 NOTE — Progress Notes (Signed)
Patient ID: Jade Reese, female   DOB: 09-Aug-1955, 67 y.o.   MRN: [id number]  HPI Jade Reese is a 67 y.o. female in consultation at the request of Ms. Pender FNP and Dr Caralee Ates for lipoma of the face.  He reports that he has been slowly growing with time and she has had it for least a couple of years.  There is no evidence of pain.  No fevers no chills no drainage.  She did have an ultrasound that I personally review A 2.1 cm nodule/mass left forehead consistent with lipoma.  No evidence of concerning features. Did have a prior soft tissue mass excised about 5 years ago on the right shoulder [facility] under GETA. Is able to perform more than 4 METS of activity without any shortness of breath or chest pain.  She is accompanied by her husband. Originally from [place] more than [place]. Denies any easy bruising or bleeding. CBC and BMP completely normal.  HPI  Past Medical History:  Diagnosis Date   Allergy 2003/07/11   pollen seasons   Arthritis    Depression 2015/07/11   on n off, minor , controled   Osteoporosis    Vaginal atrophy     Past Surgical History:  Procedure Laterality Date   ABDOMINAL HYSTERECTOMY     BREAST BIOPSY     CYST EXCISION      Family History  Adopted: Yes    Social History Social History   Tobacco Use   Smoking status: Never   Smokeless tobacco: Never  Vaping Use   Vaping status: Never Used  Substance Use Topics   Alcohol use: Never   Drug use: Never    Allergies  Allergen Reactions   Penicillins Rash   Bee Venom Other (See Comments)    Other reaction(s): HIVES Other reaction(s): HIVES    Oxaprozin Other (See Comments)    Lumbar pain Lumbar pain    Sodium Hyaluronate (Avian) Other (See Comments)    Other reaction(s): HIVES Other reaction(s): HIVES    Voltaren [Diclofenac Sodium] Swelling    Swelling on face    Current Outpatient Medications  Medication Sig Dispense Refill   alendronate  (FOSAMAX) 70 MG tablet Take 1 tablet (70 mg total) by mouth once a week. 12 tablet 1   Calcium Carb-Cholecalciferol (CALCIUM 600+D3 PO) Take by mouth.     celecoxib (CELEBREX) 100 MG capsule Take 1 capsule (100 mg total) by mouth daily. 90 capsule 0   Multiple Vitamins-Minerals (MULTIVITAMIN WOMEN) TABS Take by mouth.     Polyethylene Glycol 3350 (MIRALAX PO) Take by mouth.     cetirizine (ZYRTEC) 10 MG tablet Take 10 mg by mouth daily. (Patient not taking: Reported on 02/07/2023)     No current facility-administered medications for this visit.     Review of Systems Full ROS  was asked and was negative except for the information on the HPI  Physical Exam Blood pressure 131/85, pulse 69, temperature 98.3 F (36.8 C), height 5\' 1"  (1.549 m), weight 133 lb 9.6 oz (60.6 kg), SpO2 99%. CONSTITUTIONAL: . EYES: Pupils are equal, round,Sclera are non-icteric. EARS, NOSE, MOUTH AND THROAT: The oral mucosa is pink and moist. Hearing is intact to voice. Neck: no masses, nml thyroid and central trachea LYMPH NODES:  Lymph nodes in the neck are normal. RESPIRATORY:  Lungs are clear. There is normal respiratory effort, with equal breath sounds bilaterally, and without pathologic use of accessory muscles.  CARDIOVASCULAR: Heart is regular without murmurs, gallops, or rubs. GI: The abdomen is  soft, nontender, and nondistended. There are no palpable masses. There is no hepatosplenomegaly. There are normal bowel sounds in all quadrants. GU: Rectal deferred.   MUSCULOSKELETAL: Normal muscle strength and tone. No cyanosis or edema.   SKIN: Turgor is good and there are no pathologic skin lesions or ulcers. Prior Right shoulder scar. 2.5 cm Left supraorbital soft tissue mass c/w lipoma, mobile and not adhere to deep tissues. NEUROLOGIC: Motor and sensation is grossly normal. Cranial nerves are grossly intact. PSYCH:  Oriented to person, place and time. Affect is normal.  Data Reviewed  I have personally  reviewed the patient's imaging, laboratory findings and medical records.    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  Given the fact that the mass is growing I do think that excision will be prudent.  That we will obtain final pathology confirmation.  I do think that we can potentially do it here in the office.  Discussed with the patient and the family in detail about her disease process.  Options of excision here in the office under local versus excision in the OR under anesthetic.  Good discussion with them they have decided to have this removed here in the office under local.  Procedure discussed with them in detail.  Risk, benefits and possible complications including but not limited to: Bleeding, infection, pathology upgrade, potential scarring and deformity.  They understand and wish to proceed. Copy of this report was sent to the referring provider  Please note that I spent  in this encounter including personally reviewing imaging studies, coordinating her care and performing appropriate documentation   Sterling Big, MD FACS General Surgeon 02/07/2023, 10:22 AM

## 2023-02-07 NOTE — Progress Notes (Deleted)
  Surgical Consultation  02/07/2023  Jade Reese is an 67 y.o. female.     HPI: ***  Past Medical History:  Diagnosis Date  . Allergy 2003/07/11   pollen seasons  . Arthritis   . Depression 2015/07/11   on n off, minor , controled  . Osteoporosis   . Vaginal atrophy     Past Surgical History:  Procedure Laterality Date  . ABDOMINAL HYSTERECTOMY    . BREAST BIOPSY    . CYST EXCISION      Family History  Adopted: Yes    Social History:  reports that she has never smoked. She has never used smokeless tobacco. She reports that she does not drink alcohol and does not use drugs.  Allergies:  Allergies  Allergen Reactions  . Penicillins Rash  . Bee Venom Other (See Comments)    Other reaction(s): HIVES Other reaction(s): HIVES   . Oxaprozin Other (See Comments)    Lumbar pain Lumbar pain   . Sodium Hyaluronate (Avian) Other (See Comments)    Other reaction(s): HIVES Other reaction(s): HIVES   . Voltaren [Diclofenac Sodium] Swelling    Swelling on face    Medications reviewed.     ROS Full ROS performed and is otherwise negative other than what is stated in the HPI    There were no vitals taken for this visit.  Physical Exam  Assessment/Plan:  Please note that I spent  in this encounter including personally reviewing imaging studies, coordinating her care and performing appropriate documentation   Sterling Big, MD [facility] General Surgeon

## 2023-02-07 NOTE — Patient Instructions (Addendum)
If you have any concerns or questions, please feel free to call our office. See follow up appointment.   Lipoma Removal Lipoma removal is a surgical procedure to remove a lipoma, which is a noncancerous (benign) tumor that is made up of fat cells. Most lipomas are small and painless and do not require treatment. They can form in many areas of the body but are most common under the skin of the back, arms, shoulders, buttocks, and thighs. You may need lipoma removal if you have a lipoma that is large, growing, or causing discomfort. Lipoma removal may also be done for cosmetic reasons. Tell a health care provider about: Any allergies you have. All medicines you are taking, including vitamins, herbs, eye drops, creams, and over-the-counter medicines. Any problems you or family members have had with anesthetic medicines. Any bleeding problems you have. Any surgeries you have had. Any medical conditions you have. Whether you are pregnant or may be pregnant. What are the risks? Generally, this is a safe procedure. However, problems may occur, including: Infection. Bleeding. Scarring. Allergic reactions to medicines. Damage to nearby structures or organs, such as damage to nerves or blood vessels near the lipoma. What happens before the procedure? When to Stop Eating and Drinking Follow instructions from your health care provider about what you may eat and drink before your procedure. These may include: 8 hours before your procedure Stop eating most foods. Do not eat meat, fried foods, or fatty foods. Eat only light foods, such as toast or crackers. All liquids are okay except energy drinks and alcohol. 6 hours before your procedure Stop eating. Drink only clear liquids, such as water, clear fruit juice, black coffee, plain tea, and sports drinks. Do not drink energy drinks or alcohol. 2 hours before your procedure Stop drinking all liquids. You may be allowed to take medicines with small  sips of water. If you do not follow your health care provider's instructions, your procedure may be delayed or canceled. Medicines Ask your health care provider about: Changing or stopping your regular medicines. This is especially important if you are taking diabetes medicines or blood thinners. Taking medicines such as aspirin and ibuprofen. These medicines can thin your blood. Do not take these medicines unless your health care provider tells you to take them. Taking over-the-counter medicines, vitamins, herbs, and supplements. General instructions You will have a physical exam. Your health care provider will check the size of the lipoma and whether it can be removed easily. You may have a biopsy and imaging tests, such as X-rays, a CT scan, and an MRI. Do not use any products that contain nicotine or tobacco for at least 4 weeks before the procedure. These products include cigarettes, chewing tobacco, and vaping devices, such as e-cigarettes. If you need help quitting, ask your health care provider. Ask your health care provider: How your surgery site will be marked. What steps will be taken to help prevent infection. These may include: Washing skin with a germ-killing soap. Taking antibiotic medicine. If you will be going home right after the procedure, plan to have a responsible adult: Take you home from the hospital or clinic. You will not be allowed to drive. Care for you for the time you are told. What happens during the procedure?  An IV will be inserted into one of your veins. You will be given one or more of the following: A medicine to help you relax (sedative). A medicine to numb the area (local anesthetic). A medicine  to make you fall asleep (general anesthetic). A medicine that is injected into an area of your body to numb everything below the injection site (regional anesthetic). An incision will be made into the skin over the lipoma or very near the lipoma. The incision  may be made in a natural skin line or crease. Tissues, nerves, and blood vessels near the lipoma will be moved out of the way. The lipoma and the capsule that surrounds it will be separated from the surrounding tissues. The lipoma will be removed. The incision may be closed with stitches (sutures). A bandage (dressing) will be placed over the incision. The procedure may vary among health care providers and hospitals. What happens after the procedure? Your blood pressure, heart rate, breathing rate, and blood oxygen level will be monitored until you leave the hospital or clinic. If you were prescribed an antibiotic medicine, use it as told by your health care provider. Do not stop using the antibiotic even if you start to feel better. If you were given a sedative during the procedure, it can affect you for several hours. Do not drive or operate machinery until your health care provider says that it is safe. Where to find more information OrthoInfo: orthoinfo.aaos.org Summary Before the procedure, follow instructions from your health care provider about eating and drinking, and changing or stopping your regular medicines. This is especially important if you are taking diabetes medicines or blood thinners. After the lipoma is removed, the incision may be closed with stitches (sutures) and covered with a bandage (dressing). If you were given a sedative during the procedure, it can affect you for several hours. Do not drive or operate machinery until your health care provider says that it is safe. This information is not intended to replace advice given to you by your health care provider. Make sure you discuss any questions you have with your health care provider. Document Revised: 07/15/2021 Document Reviewed: 07/15/2021 Elsevier Patient Education  2024 [employer].

## 2023-02-19 ENCOUNTER — Ambulatory Visit: Payer: Medicare Other | Admitting: Surgery

## 2023-02-19 ENCOUNTER — Encounter: Payer: Self-pay | Admitting: Surgery

## 2023-02-19 VITALS — BP 146/88 | HR 71 | Temp 98.5°F | Ht 61.0 in | Wt 133.8 lb

## 2023-02-19 DIAGNOSIS — D170 Benign lipomatous neoplasm of skin and subcutaneous tissue of head, face and neck: Secondary | ICD-10-CM | POA: Diagnosis not present

## 2023-02-19 NOTE — Progress Notes (Signed)
DIAGNOSIS Symptomatic soft tissue mass forehead  PROCEDURES 1.  Excision of subfascial soft tissue  mass Left forehead 2.5 cm  ANESTHESIA: Lidocaine 1% with epinephrine  EBL: Minimal  FINDINGS: Deep subfascial Lipoma  After informed consent was obtained the patient was prepped and draped in the usual sterile fashion.  Lidocaine 1% was injected over the area of interest.  15 blade knife used to create an incision and the subcutaneous tissue was dissected free with hemostats.  We found the mass to be below the fascia and in between the frontalis muscle fibers.  The mass was dissected free from adjacent structures using Metzenbaum scissors.  It was sent for permanent pathology.  Hemostasis obtained with pressure.  The wound was closed in a 2 layer fashion with dermal layer using interrupted 3-0 Vicryl.  The skin was closed in a subcuticular fashion using 4-0 Monocryl.  Dermabond was applied.  No complications. The  patient tolerated procedure well

## 2023-02-19 NOTE — Patient Instructions (Signed)
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 Please see information below regarding this type of tumor.  You are free to shower in 48 hours. This will be on 02/21/23.  You have glue on your skin and sutures under the skin. The glue will come off on it's own in 10-14 days. You may shower normally until this occurs but do not submerge.  Please use Tylenol or Ibuprofen for pain as needed. You may use ice to the area 3-4 times today and tomorrow for any achiness.   We will see you back in 7-10 days to ensure that this has healed and to review the final pathology. Please see your appointment below. You may continue your regular activities right away but if you are having pain while doing something, stop what you are doing and try this activity once again in 3 days. Please call our office with any questions or concerns prior to your appointment.   Lipoma Removal Lipoma removal is a surgical procedure to remove a noncancerous (benign) tumor that is made up of fat cells (lipoma). Most lipomas are small and painless and do not require treatment. They can form in many areas of the body but are most common under the skin of the back, shoulders, arms, and thighs. You may need lipoma removal if you have a lipoma that is large, growing, or causing discomfort. Lipoma removal may also be done for cosmetic reasons. Tell a health care provider about: Any allergies you have. All medicines you are taking, including vitamins, herbs, eye drops, creams, and over-the-counter medicines. Any problems you or family members have had with anesthetic medicines. Any blood disorders you have. Any surgeries you have had. Any medical conditions you have. Whether you are pregnant or may be pregnant. What are the risks? Generally, this is a safe procedure. However, problems may occur, including: Infection. Bleeding. Allergic reactions to medicines. Damage to nerves or blood vessels near the  lipoma. Scarring.  Medicines Ask your health care provider about: Changing or stopping your regular medicines. This is especially important if you are taking diabetes medicines or blood thinners. Taking medicines such as aspirin and ibuprofen. These medicines can thin your blood. Do not take these medicines before your procedure if your health care provider instructs you not to. You may be given antibiotic medicine to help prevent infection. General instructions Ask your health care provider how your surgical site will be marked or identified. You will have a physical exam. Your health care provider will check the size of the lipoma and whether it can be moved easily.  What happens during the procedure? To reduce your risk of infection: Your health care team will wash or sanitize their hands. Your skin will be washed with surgical soap. You will be given the following: A medicine to numb the area (local anesthetic). An incision will be made over the lipoma or very near the lipoma. The incision may be made in a natural skin line or crease. Tissues, nerves, and blood vessels near the lipoma will be moved out of the way. The lipoma and the capsule that surrounds it will be separated from the surrounding tissues. The lipoma will be removed. The incision may be closed with stitches and surgical glue

## 2023-02-23 ENCOUNTER — Ambulatory Visit: Payer: Self-pay

## 2023-02-23 DIAGNOSIS — R519 Headache, unspecified: Secondary | ICD-10-CM | POA: Diagnosis not present

## 2023-02-23 DIAGNOSIS — N3001 Acute cystitis with hematuria: Secondary | ICD-10-CM | POA: Diagnosis not present

## 2023-02-23 DIAGNOSIS — Z03818 Encounter for observation for suspected exposure to other biological agents ruled out: Secondary | ICD-10-CM | POA: Diagnosis not present

## 2023-02-23 DIAGNOSIS — T7840XA Allergy, unspecified, initial encounter: Secondary | ICD-10-CM | POA: Diagnosis not present

## 2023-02-23 DIAGNOSIS — R300 Dysuria: Secondary | ICD-10-CM | POA: Diagnosis not present

## 2023-02-23 DIAGNOSIS — R5383 Other fatigue: Secondary | ICD-10-CM | POA: Diagnosis not present

## 2023-02-23 NOTE — Telephone Encounter (Signed)
Summary: facial swelling and questioning an UTII   Per Son mother recently had lipomas removed  at the top of her forehead, she is now experiencing Facial Swelling they called Dr Everlene Farrier but the person who answered told them to call their PCP  also son said mother is having Frequent urination and pressure        Chief Complaint: Son reports eyes are puffy, some pain, no itching. Has fatigue, low grade fever. Also having urinary frequency. Recent facial surgery. Surgeon's office instructed them to call PCP. No availability in office. Symptoms: Above. Frequency: Yesterday. Pertinent Negatives: Patient denies any SOB or chest pain. Disposition: [] ED /[x] Urgent Care (no appt availability in office) / [] Appointment(In office/virtual)/ []  [place] [facility]/ [] Home Care/ [] Refused Recommended Disposition /[] [place] [facility]/ []  Follow-up with PCP Additional Notes: Son agrees with UC.  Reason for Disposition  Face swelling is painful to touch  Answer Assessment - Initial Assessment Questions 1. ONSET: "When did the swelling start?" (e.g., minutes, hours, days)     Yesterday 2. LOCATION: "What part of the face is swollen?"     Both eyes, lower lids 3. SEVERITY: "How swollen is it?"     Puffy 4. ITCHING: "Is there any itching?" If Yes, ask: "How much?"   (Scale 1-10; mild, moderate or severe)     No 5. PAIN: "Is the swelling painful to touch?" If Yes, ask: "How painful is it?"   (Scale 1-10; mild, moderate or severe)   - NONE (0): no pain   - MILD (1-3): doesn't interfere with normal activities    - MODERATE (4-7): interferes with normal activities or awakens from sleep    - SEVERE (8-10): excruciating pain, unable to do any normal activities      Mild 6. FEVER: "Do you have a fever?" If Yes, ask: "What is it, how was it measured, and when did it start?"      Low grade 7. CAUSE: "What do you think is causing the face swelling?"     Unsure 8. RECURRENT SYMPTOM: "Have you had face  swelling before?" If Yes, ask: "When was the last time?" "What happened that time?"     No 9. OTHER SYMPTOMS: "Do you have any other symptoms?" (e.g., toothache, leg swelling)     Headache, fatigue, frequency 10. PREGNANCY: "Is there any chance you are pregnant?" "When was your last menstrual period?"       No  Protocols used: Face Swelling-A-AH

## 2023-03-05 ENCOUNTER — Ambulatory Visit: Payer: Medicare Other | Admitting: Surgery

## 2023-03-05 ENCOUNTER — Encounter: Payer: Self-pay | Admitting: Surgery

## 2023-03-05 VITALS — BP 145/90 | HR 80 | Temp 98.0°F | Ht 61.0 in | Wt 133.0 lb

## 2023-03-05 DIAGNOSIS — Z09 Encounter for follow-up examination after completed treatment for conditions other than malignant neoplasm: Secondary | ICD-10-CM

## 2023-03-05 DIAGNOSIS — D170 Benign lipomatous neoplasm of skin and subcutaneous tissue of head, face and neck: Secondary | ICD-10-CM

## 2023-03-05 NOTE — Patient Instructions (Signed)
   Follow-up with our office as needed.  Please call and ask to speak with a nurse if you develop questions or concerns.  

## 2023-03-06 NOTE — Progress Notes (Signed)
Jade Reese is 2 weeks out from excision of forehead lipoma.  Pathology discussed with her.  Review was conducted with interpreter To have some eye swelling that subsided.  Parents should call the office.  I am not sure if there was a communication issue.  She also was complaining of some rash in her lower abdomen.  This rash is unrelated to surgery and she was telling me that she has a lot of allergies and she thinks is related to an allergy  PE NAD Wound is  healing well without evidence of infection.  Nonie Hoyer, pupils equal and reactive Media Information  Document Information  Photos  Post surgery  03/05/2023 10:47  Attached To:  Office Visit on 03/05/23 with Leafy Ro, MD  Source Information  Ajahnae Rathgeber, [place] F, MD  [facility]  Document History    A/p Doing well after lipoma removal NO surgical issues or complications  at this time

## 2023-04-20 ENCOUNTER — Other Ambulatory Visit: Payer: Self-pay | Admitting: Internal Medicine

## 2023-04-20 DIAGNOSIS — M1990 Unspecified osteoarthritis, unspecified site: Secondary | ICD-10-CM

## 2023-04-20 NOTE — Telephone Encounter (Signed)
Requested medication (s) are due for refill today - unsure  Requested medication (s) are on the active medication list -yes  Future visit scheduled -yes  Last refill: 12/07/22 #90  Notes to clinic: Original Rx written for acute visit problem- sent for review to continue   Requested Prescriptions  Pending Prescriptions Disp Refills   celecoxib (CELEBREX) 100 MG capsule [Pharmacy Med Name: Celecoxib 100 MG Oral Capsule] 90 capsule 0    Sig: Take 1 capsule by mouth once daily     Analgesics:  COX2 Inhibitors Failed - 04/20/2023  9:45 AM      Failed - Manual Review: Labs are only required if the patient has taken medication for more than 8 weeks.      Passed - HGB in normal range and within 360 days    Hemoglobin  Date Value Ref Range Status  07/14/2022 13.4 11.7 - 15.5 g/dL Final         Passed - Cr in normal range and within 360 days    Creat  Date Value Ref Range Status  07/14/2022 0.69 0.50 - 1.05 mg/dL Final         Passed - HCT in normal range and within 360 days    HCT  Date Value Ref Range Status  07/14/2022 39.3 35.0 - 45.0 % Final         Passed - AST in normal range and within 360 days    AST  Date Value Ref Range Status  07/14/2022 16 10 - 35 U/L Final         Passed - ALT in normal range and within 360 days    ALT  Date Value Ref Range Status  07/14/2022 11 6 - 29 U/L Final         Passed - eGFR is 30 or above and within 360 days    eGFR  Date Value Ref Range Status  07/14/2022 95 > OR = 60 mL/min/1.65m2 Final         Passed - Patient is not pregnant      Passed - Valid encounter within last 12 months    Recent Outpatient Visits           2 months ago Lipoma of face   [facility] Berniece Salines, FNP   4 months ago Foot pain, right   [facility] Margarita Mail, DO   7 months ago Strain of lumbar region, initial encounter   [facility] Margarita Mail, DO    9 months ago Epigastric abdominal pain   [facility] Margarita Mail, DO   12 months ago Numbness and tingling of foot   [facility] Margarita Mail, DO       Future Appointments             In 1 month Margarita Mail, DO [facility] [place], [facility]               Requested Prescriptions  Pending Prescriptions Disp Refills   celecoxib (CELEBREX) 100 MG capsule [Pharmacy Med Name: Celecoxib 100 MG Oral Capsule] 90 capsule 0    Sig: Take 1 capsule by mouth once daily     Analgesics:  COX2 Inhibitors Failed - 04/20/2023  9:45 AM      Failed - Manual Review: Labs are only required if the patient has taken medication for more than 8 weeks.  Passed - HGB in normal range and within 360 days    Hemoglobin  Date Value Ref Range Status  07/14/2022 13.4 11.7 - 15.5 g/dL Final         Passed - Cr in normal range and within 360 days    Creat  Date Value Ref Range Status  07/14/2022 0.69 0.50 - 1.05 mg/dL Final         Passed - HCT in normal range and within 360 days    HCT  Date Value Ref Range Status  07/14/2022 39.3 35.0 - 45.0 % Final         Passed - AST in normal range and within 360 days    AST  Date Value Ref Range Status  07/14/2022 16 10 - 35 U/L Final         Passed - ALT in normal range and within 360 days    ALT  Date Value Ref Range Status  07/14/2022 11 6 - 29 U/L Final         Passed - eGFR is 30 or above and within 360 days    eGFR  Date Value Ref Range Status  07/14/2022 95 > OR = 60 mL/min/1.44m2 Final         Passed - Patient is not pregnant      Passed - Valid encounter within last 12 months    Recent Outpatient Visits           2 months ago Lipoma of face   [facility] Berniece Salines, FNP   4 months ago Foot pain, right   [facility] Margarita Mail, DO   7 months ago Strain of lumbar region,  initial encounter   [facility] Margarita Mail, DO   9 months ago Epigastric abdominal pain   [facility] Margarita Mail, DO   12 months ago Numbness and tingling of foot   [facility] Margarita Mail, DO       Future Appointments             In 1 month Margarita Mail, DO [facility], [facility]

## 2023-04-24 ENCOUNTER — Other Ambulatory Visit: Payer: Self-pay | Admitting: Internal Medicine

## 2023-04-24 DIAGNOSIS — M1990 Unspecified osteoarthritis, unspecified site: Secondary | ICD-10-CM

## 2023-04-25 NOTE — Telephone Encounter (Signed)
Requested medication (s) are due for refill today: yes  Requested medication (s) are on the active medication list: yes  Last refill:  12/07/22 #90/0  Future visit scheduled: yes  Notes to clinic:  previously refused, review for refill. Prescribed by Dr. Caralee Ates and has FU with her.      Requested Prescriptions  Pending Prescriptions Disp Refills   celecoxib (CELEBREX) 100 MG capsule [Pharmacy Med Name: Celecoxib 100 MG Oral Capsule] 90 capsule 0    Sig: Take 1 capsule by mouth once daily     Analgesics:  COX2 Inhibitors Failed - 04/24/2023  5:05 PM      Failed - Manual Review: Labs are only required if the patient has taken medication for more than 8 weeks.      Passed - HGB in normal range and within 360 days    Hemoglobin  Date Value Ref Range Status  07/14/2022 13.4 11.7 - 15.5 g/dL Final         Passed - Cr in normal range and within 360 days    Creat  Date Value Ref Range Status  07/14/2022 0.69 0.50 - 1.05 mg/dL Final         Passed - HCT in normal range and within 360 days    HCT  Date Value Ref Range Status  07/14/2022 39.3 35.0 - 45.0 % Final         Passed - AST in normal range and within 360 days    AST  Date Value Ref Range Status  07/14/2022 16 10 - 35 U/L Final         Passed - ALT in normal range and within 360 days    ALT  Date Value Ref Range Status  07/14/2022 11 6 - 29 U/L Final         Passed - eGFR is 30 or above and within 360 days    eGFR  Date Value Ref Range Status  07/14/2022 95 > OR = 60 mL/min/1.18m2 Final         Passed - Patient is not pregnant      Passed - Valid encounter within last 12 months    Recent Outpatient Visits           2 months ago Lipoma of face   [facility] Berniece Salines, FNP   4 months ago Foot pain, right   [facility] Margarita Mail, DO   7 months ago Strain of lumbar region, initial encounter   [facility]  Margarita Mail, DO   9 months ago Epigastric abdominal pain   [facility] Margarita Mail, DO   1 year ago Numbness and tingling of foot   [facility] Margarita Mail, DO       Future Appointments             In 1 month Margarita Mail, DO [facility], [facility]

## 2023-04-26 ENCOUNTER — Other Ambulatory Visit: Payer: Self-pay | Admitting: Internal Medicine

## 2023-04-26 DIAGNOSIS — M1990 Unspecified osteoarthritis, unspecified site: Secondary | ICD-10-CM

## 2023-04-26 NOTE — Telephone Encounter (Signed)
Pt needs refill of this medication please. Thanks

## 2023-04-30 NOTE — Telephone Encounter (Signed)
She came in one day with bottle asking for refill

## 2023-05-02 DIAGNOSIS — K080 Exfoliation of teeth due to systemic causes: Secondary | ICD-10-CM | POA: Diagnosis not present

## 2023-05-02 MED ORDER — CELECOXIB 100 MG PO CAPS
100.0000 mg | ORAL_CAPSULE | Freq: Every day | ORAL | 0 refills | Status: AC
Start: 2023-05-02 — End: ?

## 2023-05-12 LAB — FECAL OCCULT BLOOD, IMMUNOCHEMICAL: IFOBT: NEGATIVE

## 2023-06-04 DIAGNOSIS — M810 Age-related osteoporosis without current pathological fracture: Secondary | ICD-10-CM | POA: Diagnosis not present

## 2023-06-04 DIAGNOSIS — R7303 Prediabetes: Secondary | ICD-10-CM | POA: Diagnosis not present

## 2023-06-04 DIAGNOSIS — M79671 Pain in right foot: Secondary | ICD-10-CM | POA: Diagnosis not present

## 2023-06-12 ENCOUNTER — Ambulatory Visit: Payer: Medicare Other | Admitting: Internal Medicine

## 2023-09-03 DIAGNOSIS — Z23 Encounter for immunization: Secondary | ICD-10-CM | POA: Diagnosis not present

## 2023-09-03 DIAGNOSIS — L659 Nonscarring hair loss, unspecified: Secondary | ICD-10-CM | POA: Diagnosis not present

## 2023-09-03 DIAGNOSIS — R109 Unspecified abdominal pain: Secondary | ICD-10-CM | POA: Diagnosis not present

## 2023-09-03 DIAGNOSIS — H6691 Otitis media, unspecified, right ear: Secondary | ICD-10-CM | POA: Diagnosis not present

## 2023-09-03 DIAGNOSIS — H7291 Unspecified perforation of tympanic membrane, right ear: Secondary | ICD-10-CM | POA: Diagnosis not present

## 2023-09-03 DIAGNOSIS — M810 Age-related osteoporosis without current pathological fracture: Secondary | ICD-10-CM | POA: Diagnosis not present

## 2023-11-09 IMAGING — CR DG CHEST 2V
1 series · 2 of 2 positions shown · non-contrast
Comparison: None.

CLINICAL DATA: Pleural calcification

EXAM:
CHEST - 2 VIEW

[Series 1: dg chest 2 view · 0.14mm/px · 2 of 2 slices shown]
[im 1/2]
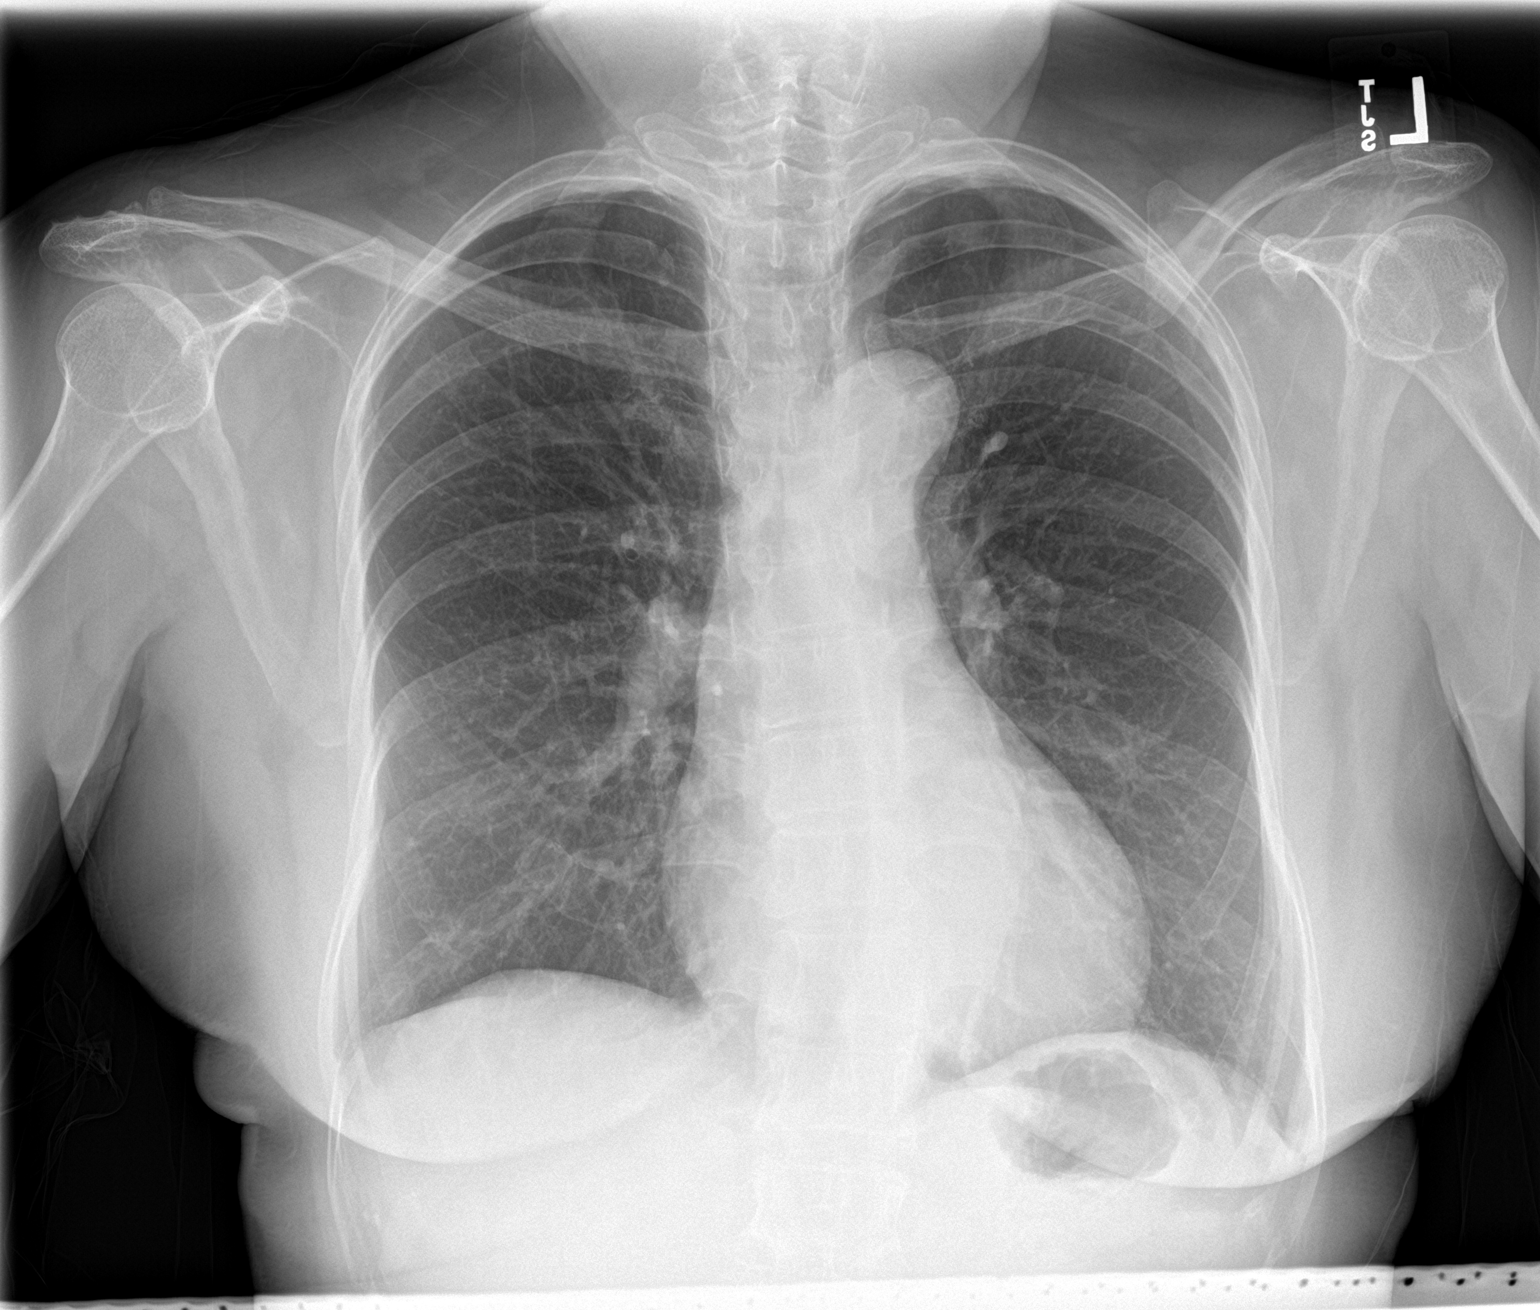
[im 2/2]
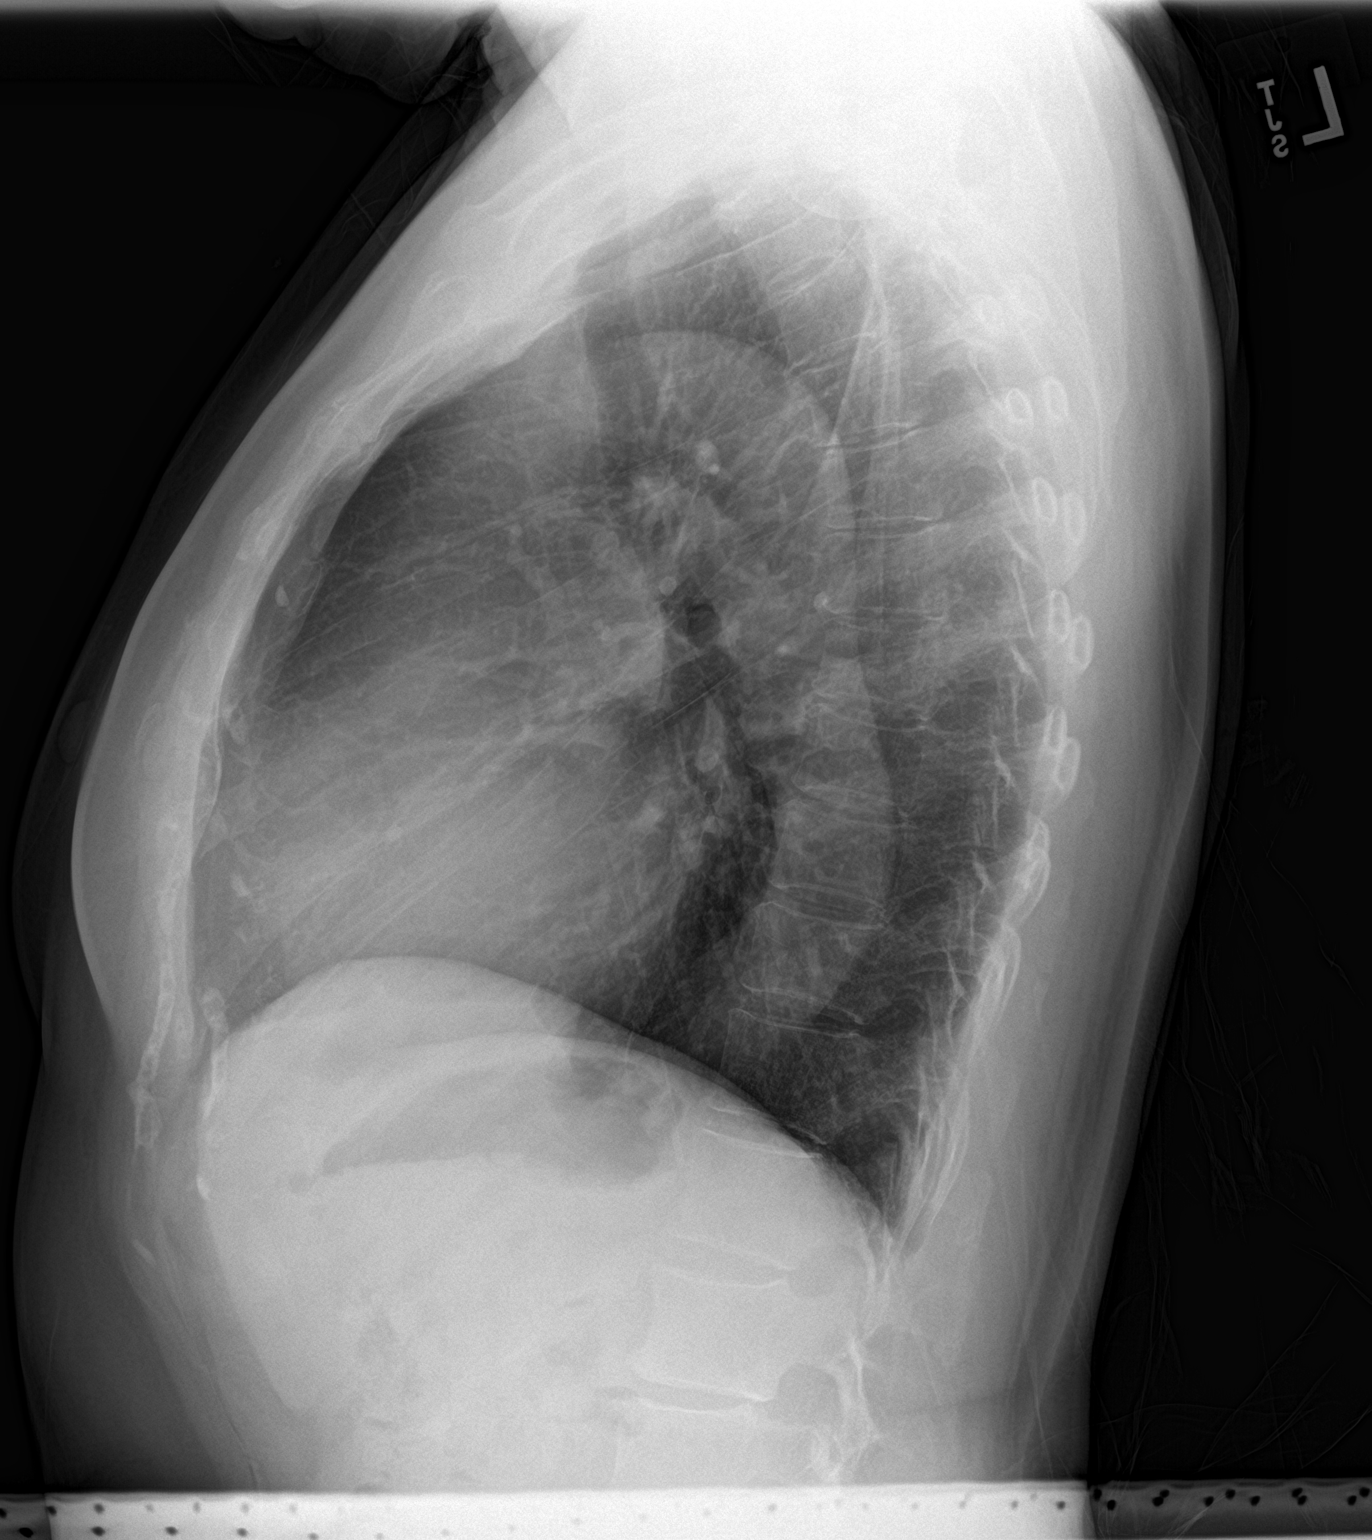

[2 of 2 positions shown; findings below may reference images not displayed]

FINDINGS: Lungs are clear. No pneumothorax or pleural effusion. Cardiac size
within normal limits. Thoracic aorta is tortuous, but otherwise
unremarkable. Pleural calcifications are not well appreciated on
this examination. Remote left clavicular mid-diaphyseal fracture
again noted. No acute bone abnormality.
IMPRESSION: No radiographic evidence of acute cardiopulmonary disease.

Reported right apical medial pleural calcifications are not well
appreciated on this examination. Prior examinations, however, are
not available for direct comparison.
